# Patient Record
Sex: Female | Born: 1950
Health system: Southern US, Community
[De-identification: ages and names within clinical notes are randomized; demographics above are authoritative.]

## PROBLEM LIST (undated history)

## (undated) DIAGNOSIS — M1990 Unspecified osteoarthritis, unspecified site: Secondary | ICD-10-CM

## (undated) DIAGNOSIS — B019 Varicella without complication: Secondary | ICD-10-CM

## (undated) DIAGNOSIS — M810 Age-related osteoporosis without current pathological fracture: Secondary | ICD-10-CM

## (undated) HISTORY — DX: Age-related osteoporosis without current pathological fracture: M81.0

## (undated) HISTORY — DX: Unspecified osteoarthritis, unspecified site: M19.90

## (undated) HISTORY — PX: TONSILLECTOMY AND ADENOIDECTOMY: SUR1326

## (undated) HISTORY — DX: Varicella without complication: B01.9

## (undated) HISTORY — PX: RHINOPLASTY: SUR1284

---

## 1985-08-29 HISTORY — PX: TUBAL LIGATION: SHX77

## 2001-10-03 ENCOUNTER — Other Ambulatory Visit: Admission: RE | Admit: 2001-10-03 | Discharge: 2001-10-03 | Payer: Self-pay | Admitting: Family Medicine

## 2005-12-23 ENCOUNTER — Ambulatory Visit: Payer: Self-pay | Admitting: Gastroenterology

## 2005-12-23 LAB — HM COLONOSCOPY

## 2007-09-25 ENCOUNTER — Ambulatory Visit: Payer: Self-pay | Admitting: Obstetrics and Gynecology

## 2007-10-03 ENCOUNTER — Ambulatory Visit: Payer: Self-pay | Admitting: Obstetrics and Gynecology

## 2014-07-29 ENCOUNTER — Ambulatory Visit: Payer: Self-pay | Admitting: Family Medicine

## 2014-09-04 ENCOUNTER — Encounter (INDEPENDENT_AMBULATORY_CARE_PROVIDER_SITE_OTHER): Payer: Self-pay

## 2014-09-04 ENCOUNTER — Encounter: Payer: Self-pay | Admitting: Internal Medicine

## 2014-09-04 ENCOUNTER — Ambulatory Visit (INDEPENDENT_AMBULATORY_CARE_PROVIDER_SITE_OTHER): Payer: BLUE CROSS/BLUE SHIELD | Admitting: Internal Medicine

## 2014-09-04 VITALS — BP 132/86 | HR 85 | Temp 97.9°F | Ht 63.25 in | Wt 136.5 lb

## 2014-09-04 DIAGNOSIS — Z1239 Encounter for other screening for malignant neoplasm of breast: Secondary | ICD-10-CM

## 2014-09-04 DIAGNOSIS — Z0000 Encounter for general adult medical examination without abnormal findings: Secondary | ICD-10-CM

## 2014-09-04 DIAGNOSIS — Z1211 Encounter for screening for malignant neoplasm of colon: Secondary | ICD-10-CM

## 2014-09-04 DIAGNOSIS — Z23 Encounter for immunization: Secondary | ICD-10-CM

## 2014-09-04 DIAGNOSIS — M1990 Unspecified osteoarthritis, unspecified site: Secondary | ICD-10-CM

## 2014-09-04 NOTE — Addendum Note (Signed)
Addended by: Ellamae Sia on: 09/04/2014 11:42 AM   Modules accepted: Orders

## 2014-09-04 NOTE — Patient Instructions (Signed)

## 2014-09-04 NOTE — Progress Notes (Signed)
Pre visit review using our clinic review tool, if applicable. No additional management support is needed unless otherwise documented below in the visit note. 

## 2014-09-04 NOTE — Progress Notes (Signed)
HPI  Pt presents to the clinic today to establish care. She is transferring care from Dr. Richarda Blade.  Flu: yearly, wants one today Tetanus: 2014 Zostovax: never Pap Smear: 2013-normal Mammogram: 2013 Colon Screening: 2006 Vision Screening: as needed Dentist: biannually  Arthritis: Mostly in her hands. Takes Mobic occasionally for pain in her hands. It has improved since she retired.  Diet: She pretty much eats what she wants, small portions, healthy choices Exercise: Walks daily  Past Medical History  Diagnosis Date  . Arthritis   . Chicken pox     Current Outpatient Prescriptions  Medication Sig Dispense Refill  . meloxicam (MOBIC) 15 MG tablet Take 15 mg by mouth daily.  3   No current facility-administered medications for this visit.    No Known Allergies  Family History  Problem Relation Age of Onset  . Cancer Mother     Breast  . Alcohol abuse Father   . Cancer Father     Lung and Prostate  . Arthritis Son     History   Social History  . Marital Status: Married    Spouse Name: N/A    Number of Children: N/A  . Years of Education: N/A   Occupational History  . Not on file.   Social History Main Topics  . Smoking status: Never Smoker   . Smokeless tobacco: Never Used  . Alcohol Use: 0.0 oz/week    0 Not specified per week     Comment: occasional  . Drug Use: Not on file  . Sexual Activity: Not on file   Other Topics Concern  . Not on file   Social History Narrative  . No narrative on file    ROS:  Constitutional: Denies fever, malaise, fatigue, headache or abrupt weight changes.  HEENT: Denies eye pain, eye redness, ear pain, ringing in the ears, wax buildup, runny nose, nasal congestion, bloody nose, or sore throat. Respiratory: Denies difficulty breathing, shortness of breath, cough or sputum production.   Cardiovascular: Denies chest pain, chest tightness, palpitations or swelling in the hands or feet.  Gastrointestinal: Pt reports  constipation. Denies abdominal pain, bloating, diarrhea or blood in the stool.  GU: Pt reports stress incontinence. Denies frequency, urgency, pain with urination, blood in urine, odor or discharge. Musculoskeletal: Pt reports joint pain in hands. Denies decrease in range of motion, difficulty with gait, muscle pain or joint swelling.  Skin: Denies redness, rashes, lesions or ulcercations.  Neurological: Denies dizziness, difficulty with memory, difficulty with speech or problems with balance and coordination.   No other specific complaints in a complete review of systems (except as listed in HPI above).  PE:  BP 132/86 mmHg  Pulse 85  Temp(Src) 97.9 F (36.6 C) (Oral)  Ht 5' 3.25" (1.607 m)  Wt 136 lb 8 oz (61.916 kg)  BMI 23.98 kg/m2  SpO2 98% Wt Readings from Last 3 Encounters:  09/04/14 136 lb 8 oz (61.916 kg)    General: Appears her stated age, well developed, well nourished in NAD. Skin: Warm, dry and intact. No rashes or lesions noted. HEENT: Head: normal shape and size; Eyes: sclera white, no icterus, conjunctiva pink, PERRLA and EOMs intact; Ears: Tm's gray and intact, normal light reflex; Throat/Mouth: Teeth present, mucosa pink and moist, no lesions or ulcerations noted.  Neck:. Neck supple, trachea midline. No masses, lumps or thyromegaly present.  Cardiovascular: Normal rate and rhythm. S1,S2 noted.  No murmur, rubs or gallops noted. No JVD or BLE edema. No carotid bruits  noted. Pulmonary/Chest: Normal effort and positive vesicular breath sounds. No respiratory distress. No wheezes, rales or ronchi noted.  Abdomen: Soft and nontender. Normal bowel sounds, no bruits noted. No distention or masses noted. Liver, spleen and kidneys non palpable. Musculoskeletal: Normal range of motion. No signs of joint swelling. Strength 5/5 BUE/BLE. No difficulty with gait.  Neurological: Alert and oriented. Cranial nerves II-XII grossly intact. Coordination normal.  Psychiatric: Mood and  affect normal. Behavior is normal. Judgment and thought content normal.    Assessment and Plan:  Preventative Health Maintenance:  Flu shot today Will request record from [facility] about which tetanus vaccine she received- has 76 week old grandbaby She will call insurance to inquire about shingles vaccine Will order mammogram today GI referral placed today for colonoscopy Encouraged her to visit an eye doctor at least annually She will make an appt for her pap smear with me Will check CBC, CMET, and Lipid profile today  RTC in 1 year or sooner if needed

## 2014-09-05 NOTE — Addendum Note (Signed)
Addended by: Lurlean Nanny on: 09/05/2014 09:03 AM   Modules accepted: Orders

## 2014-09-08 ENCOUNTER — Other Ambulatory Visit: Payer: Self-pay | Admitting: Internal Medicine

## 2014-09-08 ENCOUNTER — Telehealth: Payer: Self-pay

## 2014-09-08 DIAGNOSIS — E875 Hyperkalemia: Secondary | ICD-10-CM

## 2014-09-08 DIAGNOSIS — Z23 Encounter for immunization: Secondary | ICD-10-CM

## 2014-09-08 MED ORDER — ZOSTER VACCINE LIVE 19400 UNT/0.65ML ~~LOC~~ SOLR: 0.6500 mL | Freq: Once | SUBCUTANEOUS | Status: DC

## 2014-09-08 NOTE — Telephone Encounter (Signed)
[facility], verified that she has had chicken pox

## 2014-09-08 NOTE — Telephone Encounter (Signed)
Pt request shingles vaccine order sent to [facility]. Pt request cb when order sent.

## 2014-09-09 ENCOUNTER — Encounter: Payer: Self-pay | Admitting: Family Medicine

## 2014-09-09 ENCOUNTER — Telehealth: Payer: Self-pay | Admitting: Internal Medicine

## 2014-09-09 NOTE — Telephone Encounter (Signed)
Patient received your message asking if she had had chicken pox.  Patient had chicken pox as a child.  Please call patient when prescription is sent to the pharmacy.

## 2014-09-09 NOTE — Telephone Encounter (Signed)
lmovm to check--however in her chart chicken pox is listed in her problem list

## 2014-09-09 NOTE — Telephone Encounter (Signed)
I did verify in the chart. I already sent the zoster vaccine RX to pharmacy

## 2014-09-10 NOTE — Telephone Encounter (Signed)
Pt left v/m requesting status of [facility]ax to [facility]; spoke with [facility] at [place] and pharmacy did receive order for zostavax and pt s ins does cover injection per melonie. Melonie said pt does not have to call for appt but request not to come between 4 - 7 pm due to busy time at pharmacy. Left detailed v/m for pt with this information.

## 2014-09-11 ENCOUNTER — Ambulatory Visit: Payer: Self-pay | Admitting: Internal Medicine

## 2014-09-12 ENCOUNTER — Encounter: Payer: Self-pay | Admitting: Internal Medicine

## 2014-09-23 ENCOUNTER — Telehealth: Payer: Self-pay

## 2014-09-23 NOTE — Telephone Encounter (Signed)
Pt was seen 09/04/14 and was advised would get immunization record from [facility] to see if pt had tdap; pt did not sign record release; Ebony Hail at front desk said pt would need to sign release to get info; pt said was told Webb Silversmith NP would take care of. Pt request cb to see if information received from [facility] before coming to office to sign release. Pt request cb.

## 2014-09-24 ENCOUNTER — Telehealth: Payer: Self-pay

## 2014-09-24 DIAGNOSIS — Z23 Encounter for immunization: Secondary | ICD-10-CM

## 2014-09-24 NOTE — Telephone Encounter (Signed)
Pt left v/m; pt checked with previous doctor's office and pt does need a tdap immunization. Pt request order for tdap sent to [facility]. Please advise. Pt request cb when ordered.

## 2014-09-24 NOTE — Telephone Encounter (Signed)
Will need release to be signed

## 2014-09-24 NOTE — Telephone Encounter (Signed)
Pt notified as instructed and pt voiced understanding. 

## 2014-09-25 ENCOUNTER — Other Ambulatory Visit: Payer: Self-pay | Admitting: Internal Medicine

## 2014-09-25 MED ORDER — TETANUS-DIPHTH-ACELL PERTUSSIS 5-2.5-18.5 LF-MCG/0.5 IM SUSP: 0.5000 mL | Freq: Once | INTRAMUSCULAR | Status: DC

## 2014-09-25 NOTE — Addendum Note (Signed)
Addended by: Jearld Fenton on: 09/25/2014 02:45 PM   Modules accepted: Orders

## 2014-09-25 NOTE — Telephone Encounter (Signed)
Can you print this out for me and I will sign and then we can fax to pharmacy

## 2014-09-25 NOTE — Telephone Encounter (Signed)
Signed and in Caitlyn Martinez's box

## 2014-09-26 NOTE — Telephone Encounter (Signed)
Rx faxed to pharmacy as requested.

## 2014-10-09 ENCOUNTER — Encounter: Payer: Self-pay | Admitting: Internal Medicine

## 2014-10-09 ENCOUNTER — Ambulatory Visit: Payer: Self-pay | Admitting: Unknown Physician Specialty

## 2014-10-09 LAB — HM COLONOSCOPY

## 2014-10-31 ENCOUNTER — Encounter: Payer: Self-pay | Admitting: Internal Medicine

## 2014-12-22 LAB — SURGICAL PATHOLOGY

## 2015-09-11 ENCOUNTER — Ambulatory Visit (INDEPENDENT_AMBULATORY_CARE_PROVIDER_SITE_OTHER): Payer: BLUE CROSS/BLUE SHIELD | Admitting: Internal Medicine

## 2015-09-11 ENCOUNTER — Encounter: Payer: Self-pay | Admitting: Internal Medicine

## 2015-09-11 VITALS — BP 130/84 | HR 81 | Temp 98.2°F | Ht 63.5 in | Wt 133.0 lb

## 2015-09-11 DIAGNOSIS — G4700 Insomnia, unspecified: Secondary | ICD-10-CM

## 2015-09-11 DIAGNOSIS — Z0001 Encounter for general adult medical examination with abnormal findings: Secondary | ICD-10-CM | POA: Diagnosis not present

## 2015-09-11 DIAGNOSIS — Z0000 Encounter for general adult medical examination without abnormal findings: Secondary | ICD-10-CM

## 2015-09-11 DIAGNOSIS — Z23 Encounter for immunization: Secondary | ICD-10-CM

## 2015-09-11 DIAGNOSIS — L301 Dyshidrosis [pompholyx]: Secondary | ICD-10-CM

## 2015-09-11 LAB — LIPID PANEL
CHOL/HDL RATIO: 2.9 ratio (ref <=5.0)
CHOLESTEROL: 172 mg/dL (ref 125–200)
HDL: 60 mg/dL (ref >=46)
LDL Cholesterol: 93 mg/dL (ref <130)
TRIGLYCERIDES: 95 mg/dL (ref <150)
VLDL: 19 mg/dL (ref <30)

## 2015-09-11 LAB — COMPREHENSIVE METABOLIC PANEL
ALBUMIN: 4.3 g/dL (ref 3.6–5.1)
ALT: 10 U/L (ref 6–29)
AST: 15 U/L (ref 10–35)
Alkaline Phosphatase: 69 U/L (ref 33–130)
BUN: 10 mg/dL (ref 7–25)
CALCIUM: 9.9 mg/dL (ref 8.6–10.4)
CHLORIDE: 99 mmol/L (ref 98–110)
CO2: 30 mmol/L (ref 20–31)
Creat: 0.65 mg/dL (ref 0.50–0.99)
Glucose, Bld: 98 mg/dL (ref 65–99)
POTASSIUM: 4.8 mmol/L (ref 3.5–5.3)
Sodium: 138 mmol/L (ref 135–146)
TOTAL PROTEIN: 6.8 g/dL (ref 6.1–8.1)
Total Bilirubin: 0.4 mg/dL (ref 0.2–1.2)

## 2015-09-11 LAB — CBC
HEMATOCRIT: 39.4 % (ref 36.0–46.0)
HEMOGLOBIN: 12.8 g/dL (ref 12.0–15.0)
MCH: 31 pg (ref 26.0–34.0)
MCHC: 32.5 g/dL (ref 30.0–36.0)
MCV: 95.4 fL (ref 78.0–100.0)
MPV: 10.6 fL (ref 8.6–12.4)
PLATELETS: 231 10*3/uL (ref 150–400)
RBC: 4.13 MIL/uL (ref 3.87–5.11)
RDW: 12.4 % (ref 11.5–15.5)
WBC: 6.5 10*3/uL (ref 4.0–10.5)

## 2015-09-11 LAB — HEMOGLOBIN A1C
Hgb A1c MFr Bld: 5.9 % — ABNORMAL HIGH (ref <5.7)
Mean Plasma Glucose: 123 mg/dL — ABNORMAL HIGH (ref <117)

## 2015-09-11 MED ORDER — TRAZODONE HCL 50 MG PO TABS: 25.0000 mg | ORAL_TABLET | Freq: Every evening | ORAL | Status: DC | PRN

## 2015-09-11 MED ORDER — CLOBETASOL PROPIONATE 0.05 % EX OINT: 1.0000 "application " | TOPICAL_OINTMENT | Freq: Two times a day (BID) | CUTANEOUS | Status: DC

## 2015-09-11 NOTE — Progress Notes (Signed)
HPI  Pt presents to the clinic today for her annual exam  Flu: yearly, wants one today Tetanus: 08/2014 Zostovax: 08/2014 Pap Smear: 2013-normal Mammogram: Mammogram, 08/2014 at [place] Screening: 09/2014 Vision Screening: as needed Dentist: biannually   Diet: She pretty much eats what she wants, small portions, healthy choices. She does consume daily caffeine. Exercise: Walks daily  She does c/o a scaly rash on her hands. This has been intermittent. It seems worse when she wears gloves or uses hand sanitizers. She has tried Cortisone cream without any relief.  She is also having trouble sleeping. She is able to fall asleep but has trouble staying asleep. She does get up 1-2 times per night to urinate. She does not feel stressed or anxious. She has had Ambien in the past but feels like it is too strong. She would like to try something different.  Past Medical History  Diagnosis Date  . Arthritis   . Chicken pox     No current outpatient prescriptions on file.   No current facility-administered medications for this visit.    No Known Allergies  Family History  Problem Relation Age of Onset  . Cancer Mother     Breast  . Alcohol abuse Father   . Cancer Father     Lung and Prostate  . Arthritis Son     Social History   Social History  . Marital Status: Married    Spouse Name: N/A  . Number of Children: N/A  . Years of Education: N/A   Occupational History  . Not on file.   Social History Main Topics  . Smoking status: Never Smoker   . Smokeless tobacco: Never Used  . Alcohol Use: 0.0 oz/week    0 Standard drinks or equivalent per week     Comment: occasional  . Drug Use: No  . Sexual Activity: Not on file   Other Topics Concern  . Not on file   Social History Narrative    ROS:  Constitutional: Denies fever, malaise, fatigue, headache or abrupt weight changes.  HEENT: Denies eye pain, eye redness, ear pain, ringing in the ears, wax buildup,  runny nose, nasal congestion, bloody nose, or sore throat. Respiratory: Denies difficulty breathing, shortness of breath, cough or sputum production.   Cardiovascular: Denies chest pain, chest tightness, palpitations or swelling in the hands or feet.  Gastrointestinal: Pt reports constipation. Denies abdominal pain, bloating, diarrhea or blood in the stool.  GU: Pt reports stress incontinence. Denies frequency, urgency, pain with urination, blood in urine, odor or discharge. Musculoskeletal: Pt reports joint pain in hands. Denies decrease in range of motion, difficulty with gait, muscle pain or joint swelling.  Skin: Pt reports rash on her hands. Denies redness, lesions or ulcercations.  Neurological: Pt reports insomnia. Denies dizziness, difficulty with memory, difficulty with speech or problems with balance and coordination.  Psych: Denies anxiety, depression, SI/HI.   No other specific complaints in a complete review of systems (except as listed in HPI above).  PE:  BP 130/84 mmHg  Pulse 81  Temp(Src) 98.2 F (36.8 C) (Oral)  Ht 5' 3.5" (1.613 m)  Wt 133 lb (60.328 kg)  BMI 23.19 kg/m2  SpO2 98% Wt Readings from Last 3 Encounters:  09/11/15 133 lb (60.328 kg)  09/04/14 136 lb 8 oz (61.916 kg)    General: Appears her stated age, well developed, well nourished in NAD. Skin: Warm, dry and intact. Dyshidrotic eczema noted on hands bilaterally. HEENT: Head: normal shape  and size; Eyes: sclera white, no icterus, conjunctiva pink, PERRLA and EOMs intact; Ears: Tm's gray and intact, normal light reflex; Throat/Mouth: Teeth present, mucosa pink and moist, no lesions or ulcerations noted.  Neck:. Neck supple, trachea midline. No masses, lumps or thyromegaly present.  Cardiovascular: Normal rate and rhythm. S1,S2 noted.  No murmur, rubs or gallops noted. No JVD or BLE edema. No carotid bruits noted. Pulmonary/Chest: Normal effort and positive vesicular breath sounds. No respiratory  distress. No wheezes, rales or ronchi noted.  Abdomen: Soft and nontender. Normal bowel sounds. No distention or masses noted. Liver, spleen and kidneys non palpable. Musculoskeletal: Normal range of motion. No signs of joint swelling. Strength 5/5 BUE/BLE. No difficulty with gait.  Neurological: Alert and oriented. Cranial nerves II-XII grossly intact. Coordination normal.  Psychiatric: Mood and affect normal. Behavior is normal. Judgment and thought content normal.    Assessment and Plan:  Preventative Health Maintenance:  Flu shot today Tetanus UTD She will call [facility] to schedule her Mammogram Colonoscopy UTD Encouraged her to visit an eye doctor and dentist at least annually Encouraged her to continue a healthy diet and exercise regimen Will check CBC, CMET, Lipid, A1C, HIV and Hep C today  Dyshidrotic eczema:  eRx for Clobetasol cream BID until resolved Use a moisturizing lotion daily Avoid alcohol based hand gels  Insomnia:  Discussed starting a good sleep routine eRx for Trazadone 25-50 mg QHS prn  RTC in 1 year or sooner if needed

## 2015-09-11 NOTE — Patient Instructions (Signed)

## 2015-09-11 NOTE — Progress Notes (Signed)
Pre visit review using our clinic review tool, if applicable. No additional management support is needed unless otherwise documented below in the visit note. 

## 2015-09-12 ENCOUNTER — Encounter: Payer: Self-pay | Admitting: Internal Medicine

## 2015-09-12 LAB — TSH: TSH: 1.305 u[IU]/mL (ref 0.350–4.500)

## 2015-09-12 LAB — HEPATITIS C ANTIBODY: HCV AB: NEGATIVE

## 2015-09-12 LAB — HIV ANTIBODY (ROUTINE TESTING W REFLEX): HIV 1&2 Ab, 4th Generation: NONREACTIVE

## 2015-09-15 ENCOUNTER — Ambulatory Visit
Admission: RE | Admit: 2015-09-15 | Discharge: 2015-09-15 | Disposition: A | Discharge status: Home / Self Care | Payer: BLUE CROSS/BLUE SHIELD | Source: Ambulatory Visit | Attending: Internal Medicine | Admitting: Internal Medicine

## 2015-09-15 DIAGNOSIS — Z1231 Encounter for screening mammogram for malignant neoplasm of breast: Secondary | ICD-10-CM | POA: Diagnosis not present

## 2015-09-15 DIAGNOSIS — Z1239 Encounter for other screening for malignant neoplasm of breast: Secondary | ICD-10-CM

## 2015-09-25 NOTE — Addendum Note (Signed)
Addended by: Lurlean Nanny on: 09/25/2015 09:11 AM   Modules accepted: Orders

## 2015-11-24 ENCOUNTER — Telehealth: Payer: Self-pay

## 2015-11-24 NOTE — Telephone Encounter (Signed)
Pt left v/m requesting cb about 09/11/15 wellness visit. Left v/m requesting cb.

## 2015-11-27 NOTE — Telephone Encounter (Signed)
Pt was checking on 09/11/15 bill She was receiving bill for $20 She had her preventative exam and discussed other things Pt aware of this

## 2015-11-27 NOTE — Telephone Encounter (Signed)
Opened in error

## 2016-01-16 ENCOUNTER — Other Ambulatory Visit: Payer: Self-pay | Admitting: Internal Medicine

## 2016-08-02 ENCOUNTER — Telehealth: Payer: Self-pay | Admitting: Internal Medicine

## 2016-08-02 ENCOUNTER — Other Ambulatory Visit: Payer: Self-pay | Admitting: Internal Medicine

## 2016-08-02 DIAGNOSIS — Z0000 Encounter for general adult medical examination without abnormal findings: Secondary | ICD-10-CM

## 2016-08-02 NOTE — Telephone Encounter (Signed)
Yes, labs entered

## 2016-08-02 NOTE — Telephone Encounter (Signed)
Pt has a cpx schedule 1/18 she would like to get labs done prior is it ok to schedule

## 2016-08-03 ENCOUNTER — Other Ambulatory Visit: Payer: Self-pay | Admitting: Internal Medicine

## 2016-08-03 NOTE — Telephone Encounter (Signed)
Left message asking pt to call office  °

## 2016-09-12 ENCOUNTER — Other Ambulatory Visit (INDEPENDENT_AMBULATORY_CARE_PROVIDER_SITE_OTHER): Payer: BLUE CROSS/BLUE SHIELD

## 2016-09-12 DIAGNOSIS — Z0000 Encounter for general adult medical examination without abnormal findings: Secondary | ICD-10-CM

## 2016-09-12 LAB — CBC
HEMATOCRIT: 39.4 % (ref 35.0–45.0)
Hemoglobin: 12.8 g/dL (ref 11.7–15.5)
MCH: 30.8 pg (ref 27.0–33.0)
MCHC: 32.5 g/dL (ref 32.0–36.0)
MCV: 94.7 fL (ref 80.0–100.0)
MPV: 11.4 fL (ref 7.5–12.5)
PLATELETS: 243 10*3/uL (ref 140–400)
RBC: 4.16 MIL/uL (ref 3.80–5.10)
RDW: 12.6 % (ref 11.0–15.0)
WBC: 4.9 10*3/uL (ref 3.8–10.8)

## 2016-09-12 NOTE — Addendum Note (Signed)
Addended by: Frutoso Chase A on: 09/12/2016 07:58 AM   Modules accepted: Orders

## 2016-09-13 LAB — COMPREHENSIVE METABOLIC PANEL
ALK PHOS: 73 U/L (ref 33–130)
ALT: 10 U/L (ref 6–29)
AST: 16 U/L (ref 10–35)
Albumin: 4.1 g/dL (ref 3.6–5.1)
BUN: 11 mg/dL (ref 7–25)
CALCIUM: 9.5 mg/dL (ref 8.6–10.4)
CHLORIDE: 103 mmol/L (ref 98–110)
CO2: 30 mmol/L (ref 20–31)
Creat: 0.7 mg/dL (ref 0.50–0.99)
GLUCOSE: 105 mg/dL — AB (ref 65–99)
Potassium: 4.9 mmol/L (ref 3.5–5.3)
Sodium: 140 mmol/L (ref 135–146)
TOTAL PROTEIN: 6.9 g/dL (ref 6.1–8.1)
Total Bilirubin: 0.4 mg/dL (ref 0.2–1.2)

## 2016-09-13 LAB — LIPID PANEL
CHOL/HDL RATIO: 2.6 ratio (ref <5.0)
CHOLESTEROL: 162 mg/dL (ref <200)
HDL: 62 mg/dL (ref >50)
LDL Cholesterol: 80 mg/dL (ref <100)
Triglycerides: 102 mg/dL (ref <150)
VLDL: 20 mg/dL (ref <30)

## 2016-09-13 LAB — HEMOGLOBIN A1C
Hgb A1c MFr Bld: 5.7 % — ABNORMAL HIGH (ref <5.7)
Mean Plasma Glucose: 117 mg/dL

## 2016-09-13 LAB — VITAMIN D 25 HYDROXY (VIT D DEFICIENCY, FRACTURES): Vit D, 25-Hydroxy: 44 ng/mL (ref 30–100)

## 2016-09-15 ENCOUNTER — Encounter: Payer: BLUE CROSS/BLUE SHIELD | Admitting: Internal Medicine

## 2016-09-23 ENCOUNTER — Ambulatory Visit (INDEPENDENT_AMBULATORY_CARE_PROVIDER_SITE_OTHER): Payer: BLUE CROSS/BLUE SHIELD | Admitting: Internal Medicine

## 2016-09-23 ENCOUNTER — Encounter: Payer: Self-pay | Admitting: Internal Medicine

## 2016-09-23 VITALS — BP 128/78 | HR 96 | Temp 99.5°F | Ht 63.75 in | Wt 135.0 lb

## 2016-09-23 DIAGNOSIS — Z1231 Encounter for screening mammogram for malignant neoplasm of breast: Secondary | ICD-10-CM

## 2016-09-23 DIAGNOSIS — B9789 Other viral agents as the cause of diseases classified elsewhere: Secondary | ICD-10-CM

## 2016-09-23 DIAGNOSIS — J329 Chronic sinusitis, unspecified: Secondary | ICD-10-CM | POA: Diagnosis not present

## 2016-09-23 DIAGNOSIS — Z124 Encounter for screening for malignant neoplasm of cervix: Secondary | ICD-10-CM

## 2016-09-23 DIAGNOSIS — F5101 Primary insomnia: Secondary | ICD-10-CM

## 2016-09-23 DIAGNOSIS — Z0001 Encounter for general adult medical examination with abnormal findings: Secondary | ICD-10-CM | POA: Diagnosis not present

## 2016-09-23 DIAGNOSIS — G4700 Insomnia, unspecified: Secondary | ICD-10-CM | POA: Insufficient documentation

## 2016-09-23 DIAGNOSIS — Z1239 Encounter for other screening for malignant neoplasm of breast: Secondary | ICD-10-CM

## 2016-09-23 MED ORDER — HYDROCODONE-HOMATROPINE 5-1.5 MG/5ML PO SYRP: 5.0000 mL | ORAL_SOLUTION | Freq: Three times a day (TID) | ORAL | 0 refills | Status: DC | PRN

## 2016-09-23 NOTE — Patient Instructions (Signed)

## 2016-09-23 NOTE — Assessment & Plan Note (Signed)
Advised her to try going back up to a whole tablet of Trazadone If not effective, let me know

## 2016-09-23 NOTE — Progress Notes (Signed)
Subjective:    Patient ID: Caitlyn Martinez, female    DOB: [birth year], 66 y.o.   MRN: [id number]  HPI  Pt presents to the clinic today for her annual exam. She is also due to follow up chronic conditions.  Insomnia: She has trouble staying asleep. She was prescribed Trazadone 1 year ago, but she reports she took it 1 time and it made her feel very jittery. One week ago, she started taking 1/2 tablet ofTrazadone but has not noticed that it has helped with her sleep.  She also reports headaches, nasal congestion, cough and chest congestion.This started 4 days ago. She is not blowing anything out of her nose. She denies ear pain or sore throat. The cough is productive of clear mucous. She has had a fever but denies chills or body aches. She has tried Sudafed, Afrin, Mucinex, Dayquil and Nyquil with some relief. She has had sick contacts. She has not had her flu shot this year.  Flu: 08/2015 Tetanus: 08/2014 Zostavax: 08/2014 Pap Smear: 2013 Mammogram: 08/2015 Colon Screening: 09/2014 Vision Screening: as needed Dentist: yearly  Diet: She does eat meat. She consumes fruits and veggies most day. She does eat some fried foods. She drinks mostly sweet tea. Exercise: She walks 3-4 miles daily.   Review of Systems  Past Medical History:  Diagnosis Date  . Arthritis   . Chicken pox     Current Outpatient Prescriptions  Medication Sig Dispense Refill  . clobetasol ointment (TEMOVATE) [id number] % APPLY 1 APPLICATION TOPICALLY 2 (TWO) TIMES DAILY. 30 g 0  . traZODone (DESYREL) 50 MG tablet Take 0.5-1 tablets (25-50 mg total) by mouth at bedtime as needed for sleep. 30 tablet 1   No current facility-administered medications for this visit.     No Known Allergies  Family History  Problem Relation Age of Onset  . Cancer Mother     Breast  . Breast cancer Mother 67  . Alcohol abuse Father   . Cancer Father     Lung and Prostate  . Arthritis Son     Social History   Social History  .  Marital status: Married    Spouse name: N/A  . Number of children: N/A  . Years of education: N/A   Occupational History  . Not on file.   Social History Main Topics  . Smoking status: Never Smoker  . Smokeless tobacco: Never Used  . Alcohol use 0.0 oz/week     Comment: occasional  . Drug use: No  . Sexual activity: Not on file   Other Topics Concern  . Not on file   Social History Narrative  . No narrative on file     Constitutional: Pt reports headache and fever. Denies malaise, fatigue, or abrupt weight changes.  HEENT: Pt reports nasal congestion. Denies eye pain, eye redness, ear pain, ringing in the ears, wax buildup, runny nose, bloody nose, or sore throat. Respiratory: Pt reports cough. Denies difficulty breathing, shortness of breath, or sputum production.   Cardiovascular: Denies chest pain, chest tightness, palpitations or swelling in the hands or feet.  Gastrointestinal: Denies abdominal pain, bloating, constipation, diarrhea or blood in the stool.  GU: Pt reports stress incontinence. Denies urgency, frequency, pain with urination, burning sensation, blood in urine, odor or discharge. Musculoskeletal: Denies decrease in range of motion, difficulty with gait, muscle pain or joint pain and swelling.  Neurological: Pt reports insomnia. Denies dizziness, difficulty with memory, difficulty with speech or problems with balance and  coordination.  Psych: Denies anxiety, depression, SI/HI.  No other specific complaints in a complete review of systems (except as listed in HPI above).     Objective:   Physical Exam   BP 128/78   Pulse 96   Temp 99.5 F (37.5 C) (Oral)   Ht 5' 3.75" (1.619 m)   Wt 135 lb (61.2 kg)   SpO2 97%   BMI 23.35 kg/m  Wt Readings from Last 3 Encounters:  09/23/16 135 lb (61.2 kg)  09/11/15 133 lb (60.3 kg)  09/04/14 136 lb 8 oz (61.9 kg)    General: Appears her stated age, well developed, well nourished in NAD. Skin: Warm, dry and  intact.  HEENT: Head: normal shape and size, no sinus tenderness noted; Eyes: sclera white, no icterus, conjunctiva pink, PERRLA and EOMs intact; Ears: Tm's gray and intact, normal light reflex; Nose: mucosa pink and moist, septum midline; Throat/Mouth: Teeth present, mucosa erythematous and moist, + PND, no exudate, lesions or ulcerations noted.  Neck:  Neck supple, trachea midline. No masses, lumps or thyromegaly present.  Cardiovascular: Normal rate and rhythm. S1,S2 noted.  No murmur, rubs or gallops noted. No JVD or BLE edema. No carotid bruits noted. Pulmonary/Chest: Normal effort and positive vesicular breath sounds. No respiratory distress. No wheezes, rales or ronchi noted.  Abdomen: Soft and nontender. Normal bowel sounds. No distention or masses noted. Liver, spleen and kidneys non palpable. Pelvic: Normal female anatomy. Cystocele noted. Cervix a little red, os opened. No CMT. Adnexa non palpable. Musculoskeletal: Strength 5/5 BUE/BLE. No difficulty with gait.  Neurological: Alert and oriented. Cranial nerves II-XII grossly intact. Coordination normal.  Psychiatric: Mood and affect normal. Behavior is normal. Judgment and thought content normal.     BMET    Component Value Date/Time   NA 140 09/12/2016 0759   K 4.9 09/12/2016 0759   CL 103 09/12/2016 0759   CO2 30 09/12/2016 0759   GLUCOSE 105 (H) 09/12/2016 0759   BUN 11 09/12/2016 0759   CREATININE 0.70 09/12/2016 0759   CALCIUM 9.5 09/12/2016 0759    Lipid Panel     Component Value Date/Time   CHOL 162 09/12/2016 0759   TRIG 102 09/12/2016 0759   HDL 62 09/12/2016 0759   CHOLHDL 2.6 09/12/2016 0759   VLDL 20 09/12/2016 0759   LDLCALC 80 09/12/2016 0759    CBC    Component Value Date/Time   WBC 4.9 09/12/2016 0759   RBC 4.16 09/12/2016 0759   HGB 12.8 09/12/2016 0759   HCT 39.4 09/12/2016 0759   PLT 243 09/12/2016 0759   MCV 94.7 09/12/2016 0759   MCH 30.8 09/12/2016 0759   MCHC 32.5 09/12/2016 0759   RDW  12.6 09/12/2016 0759    Hgb A1C Lab Results  Component Value Date   HGBA1C 5.7 (H) 09/12/2016         Assessment & Plan:   Preventative Health Maintenance:  She can not get flu shot today, she will make nurse visit or get it at the pharmacy Tetanus and Zostovax UTD Mammogram ordered- she will call [facility] to schedule, number provided Pap smear today, she declines STD screening Encouraged her to consume a balanced diet and exercise regimen Advised her to see an eye doctor and dentist annually Labs from 1 week ago reviewed  Viral Sinusitis:  Stop Afrin and Sudafed Start steroid nasal spray OTC Continue Mucinex RX for Hycodan for cough Return precautions discussed  RTC in 1 year, sooner if needed BAITY, REGINA,  NP

## 2016-09-26 ENCOUNTER — Other Ambulatory Visit (HOSPITAL_COMMUNITY)
Admission: RE | Admit: 2016-09-26 | Discharge: 2016-09-26 | Disposition: A | Discharge status: Home / Self Care | Payer: BLUE CROSS/BLUE SHIELD | Source: Ambulatory Visit | Attending: Internal Medicine | Admitting: Internal Medicine

## 2016-09-26 DIAGNOSIS — Z01419 Encounter for gynecological examination (general) (routine) without abnormal findings: Secondary | ICD-10-CM | POA: Diagnosis present

## 2016-09-26 DIAGNOSIS — Z1151 Encounter for screening for human papillomavirus (HPV): Secondary | ICD-10-CM | POA: Diagnosis not present

## 2016-09-26 NOTE — Addendum Note (Signed)
Addended by: Lurlean Nanny on: 09/26/2016 08:45 AM   Modules accepted: Orders

## 2016-09-28 LAB — CYTOLOGY - PAP
Diagnosis: NEGATIVE
HPV: NOT DETECTED

## 2016-10-03 ENCOUNTER — Encounter: Payer: Self-pay | Admitting: Internal Medicine

## 2016-10-03 ENCOUNTER — Other Ambulatory Visit: Payer: Self-pay | Admitting: Internal Medicine

## 2016-10-03 ENCOUNTER — Ambulatory Visit (INDEPENDENT_AMBULATORY_CARE_PROVIDER_SITE_OTHER): Payer: BLUE CROSS/BLUE SHIELD | Admitting: Internal Medicine

## 2016-10-03 VITALS — BP 132/80 | HR 86 | Temp 98.0°F | Wt 133.8 lb

## 2016-10-03 DIAGNOSIS — J0100 Acute maxillary sinusitis, unspecified: Secondary | ICD-10-CM

## 2016-10-03 DIAGNOSIS — Z23 Encounter for immunization: Secondary | ICD-10-CM | POA: Diagnosis not present

## 2016-10-03 MED ORDER — PREDNISONE 10 MG PO TABS: ORAL_TABLET | ORAL | 0 refills | Status: DC

## 2016-10-03 MED ORDER — AMOXICILLIN-POT CLAVULANATE 875-125 MG PO TABS: 1.0000 | ORAL_TABLET | Freq: Two times a day (BID) | ORAL | 0 refills | Status: DC

## 2016-10-03 NOTE — Addendum Note (Signed)
Addended by: Lurlean Nanny on: 10/03/2016 11:59 AM   Modules accepted: Orders

## 2016-10-03 NOTE — Progress Notes (Signed)
HPI  Pt presents to the clinic today to follow up viral sinusitis. Her symptoms started 09/19/16. She was seen 09/23/16 for her physical and advised to stop Afrin and Sudafed. Start Flonase OTC, continue Mucinex. She was given a RX for Hycodan for cough. Since that time, she reports she has ongoing headache, facial pain and pressure, nasal congestion and pain in her teeth. She is blowing thick, yellow mucous out of her nose. She denies fever or chills but has had body aches. She has tried Afrin, Dayquil and Nyquil with minimal relief.  Review of Systems     Past Medical History:  Diagnosis Date  . Arthritis   . Chicken pox     Family History  Problem Relation Age of Onset  . Cancer Mother     Breast  . Breast cancer Mother 47  . Alcohol abuse Father   . Cancer Father     Lung and Prostate  . Arthritis Son     Social History   Social History  . Marital status: Married    Spouse name: N/A  . Number of children: N/A  . Years of education: N/A   Occupational History  . Not on file.   Social History Main Topics  . Smoking status: Never Smoker  . Smokeless tobacco: Never Used  . Alcohol use 0.0 oz/week     Comment: occasional  . Drug use: No  . Sexual activity: Not on file   Other Topics Concern  . Not on file   Social History Narrative  . No narrative on file    No Known Allergies   Constitutional: Positive headache, fatigue. Denies fever abrupt weight changes.  HEENT:  Positive facial pain, nasal congestion and teeth pain. Denies eye redness, ear pain, ringing in the ears, wax buildup, runny nose or sore throat. Respiratory:  Denies cough, difficulty breathing or shortness of breath.  Cardiovascular: Denies chest pain, chest tightness, palpitations or swelling in the hands or feet.   No other specific complaints in a complete review of systems (except as listed in HPI above).  Objective:   BP 132/80   Pulse 86   Temp 98 F (36.7 C) (Oral)   Wt 133 lb 12 oz  (60.7 kg)   SpO2 97%   BMI 23.14 kg/m   General: Appears her stated age, in NAD. HEENT: Head: normal shape and size, maxillary sinus tenderness noted; Eyes: sclera white, no icterus, conjunctiva pink; Ears: Tm's gray and intact, normal light reflex; Nose: mucosa pinkand moist, septum midline; Throat/Mouth: + PND. Teeth present, mucosa pink and moist, no exudate noted, no lesions or ulcerations noted.  Neck:  No adenopathy noted.  Cardiovascular: Normal rate and rhythm.  Pulmonary/Chest: Normal effort and positive vesicular breath sounds with intermittent expiratory wheeze noted. No respiratory distress. No wheezes, rales or ronchi noted.       Assessment & Plan:   Acute bacterial sinusitis  Can use a Neti Pot which can be purchased from your local drug store. eRx for Pred Taper x 6 days eRx for Augmentin BID for 10 days- discussed GI upset  RTC as needed or if symptoms persist. Webb Silversmith, NP

## 2016-10-03 NOTE — Patient Instructions (Signed)

## 2016-10-25 ENCOUNTER — Ambulatory Visit: Payer: BLUE CROSS/BLUE SHIELD

## 2016-11-08 ENCOUNTER — Ambulatory Visit: Payer: BLUE CROSS/BLUE SHIELD

## 2016-11-22 ENCOUNTER — Ambulatory Visit
Admission: RE | Admit: 2016-11-22 | Discharge: 2016-11-22 | Disposition: A | Discharge status: Home / Self Care | Payer: BLUE CROSS/BLUE SHIELD | Source: Ambulatory Visit | Attending: Internal Medicine | Admitting: Internal Medicine

## 2016-11-22 ENCOUNTER — Other Ambulatory Visit: Payer: Self-pay | Admitting: *Deleted

## 2016-11-22 DIAGNOSIS — Z1239 Encounter for other screening for malignant neoplasm of breast: Secondary | ICD-10-CM

## 2016-11-22 DIAGNOSIS — Z1231 Encounter for screening mammogram for malignant neoplasm of breast: Secondary | ICD-10-CM | POA: Insufficient documentation

## 2016-11-22 MED ORDER — CLOBETASOL PROPIONATE 0.05 % EX OINT: 1.0000 "application " | TOPICAL_OINTMENT | Freq: Two times a day (BID) | CUTANEOUS | 0 refills | Status: DC

## 2016-11-22 NOTE — Telephone Encounter (Signed)
Pt is requesting a refill of clobetasol ointment. Medication no longer on pts med list. Last OV 08/2016-CPE

## 2016-11-22 NOTE — Addendum Note (Signed)
Addended by: Jearld Fenton on: 11/22/2016 09:05 AM   Modules accepted: Orders

## 2016-11-22 NOTE — Telephone Encounter (Signed)
Clobetasol sent to pharmacy

## 2016-12-19 ENCOUNTER — Encounter: Payer: Self-pay | Admitting: Internal Medicine

## 2016-12-19 ENCOUNTER — Ambulatory Visit (INDEPENDENT_AMBULATORY_CARE_PROVIDER_SITE_OTHER): Payer: BLUE CROSS/BLUE SHIELD | Admitting: Internal Medicine

## 2016-12-19 VITALS — BP 126/82 | HR 82 | Temp 98.0°F | Wt 136.0 lb

## 2016-12-19 DIAGNOSIS — R3989 Other symptoms and signs involving the genitourinary system: Secondary | ICD-10-CM

## 2016-12-19 DIAGNOSIS — R300 Dysuria: Secondary | ICD-10-CM | POA: Diagnosis not present

## 2016-12-19 LAB — POC URINALSYSI DIPSTICK (AUTOMATED)
BILIRUBIN UA: NEGATIVE
Glucose, UA: NEGATIVE
KETONES UA: NEGATIVE
NITRITE UA: NEGATIVE
PH UA: 6.5 (ref 5.0–8.0)
PROTEIN UA: NEGATIVE
RBC UA: NEGATIVE
Spec Grav, UA: 1.02 (ref 1.010–1.025)
Urobilinogen, UA: 0.2 E.U./dL

## 2016-12-19 MED ORDER — NITROFURANTOIN MONOHYD MACRO 100 MG PO CAPS: 100.0000 mg | ORAL_CAPSULE | Freq: Two times a day (BID) | ORAL | 0 refills | Status: DC

## 2016-12-19 MED ORDER — CLOBETASOL PROPIONATE 0.05 % EX OINT: 1.0000 "application " | TOPICAL_OINTMENT | Freq: Two times a day (BID) | CUTANEOUS | 0 refills | Status: DC

## 2016-12-19 NOTE — Progress Notes (Signed)
HPI  Pt presents to the clinic today with c/o bladder pressure. She reports this started 10 days ago. She reports associated dysuria, but denies urgency, frequency. She denies fever, chills, nausea or low back pain. She has tried Cranberry juice with minimal relief. She denies vaginal discharge. She does drink a lot of caffeine.   Review of Systems  Past Medical History:  Diagnosis Date  . Arthritis   . Chicken pox     Family History  Problem Relation Age of Onset  . Cancer Mother     Breast  . Breast cancer Mother 2  . Alcohol abuse Father   . Cancer Father     Lung and Prostate  . Arthritis Son     Social History   Social History  . Marital status: Married    Spouse name: N/A  . Number of children: N/A  . Years of education: N/A   Occupational History  . Not on file.   Social History Main Topics  . Smoking status: Never Smoker  . Smokeless tobacco: Never Used  . Alcohol use 0.0 oz/week     Comment: occasional  . Drug use: No  . Sexual activity: Not on file   Other Topics Concern  . Not on file   Social History Narrative  . No narrative on file    No Known Allergies   Constitutional: Denies fever, malaise, fatigue, headache or abrupt weight changes.   GU: Pt reports bladder pressure and pain with urination. Denies urgency, frequency, burning sensation, blood in urine, odor or discharge. Skin: Denies redness, rashes, lesions or ulcercations.   No other specific complaints in a complete review of systems (except as listed in HPI above).    Objective:   Physical Exam  BP 126/82   Pulse 82   Temp 98 F (36.7 C) (Oral)   Wt 136 lb (61.7 kg)   SpO2 99%   BMI 23.53 kg/m  Wt Readings from Last 3 Encounters:  12/19/16 136 lb (61.7 kg)  10/03/16 133 lb 12 oz (60.7 kg)  09/23/16 135 lb (61.2 kg)    General: Appears her stated age, well developed, well nourished in NAD. Abdomen: Soft. Normal bowel sounds. No distention or masses noted.  Tender to  palpation over the bladder area. No CVA tenderness.       Assessment & Plan:   Bladder Pressure and Dysuria secondary to Possible UTI:  Urinalysis: 1+leuks Will send urine culture eRx sent if for Macrobid 100 mg BID x 5 days OK to take AZO OTC Drink plenty of fluids  RTC as needed or if symptoms persist. Webb Silversmith, NP

## 2016-12-19 NOTE — Patient Instructions (Signed)

## 2016-12-19 NOTE — Addendum Note (Signed)
Addended by: Lurlean Nanny on: 12/19/2016 12:27 PM   Modules accepted: Orders

## 2016-12-22 LAB — URINE CULTURE

## 2017-04-10 IMAGING — MG MM DIGITAL SCREENING BILAT W/ CAD
6 series · 6 of 6 positions shown · non-contrast
Comparison: Previous exam(s).

CLINICAL DATA: Screening.

EXAM:
DIGITAL SCREENING BILATERAL MAMMOGRAM WITH CAD

[R MLO (1 of 2)]
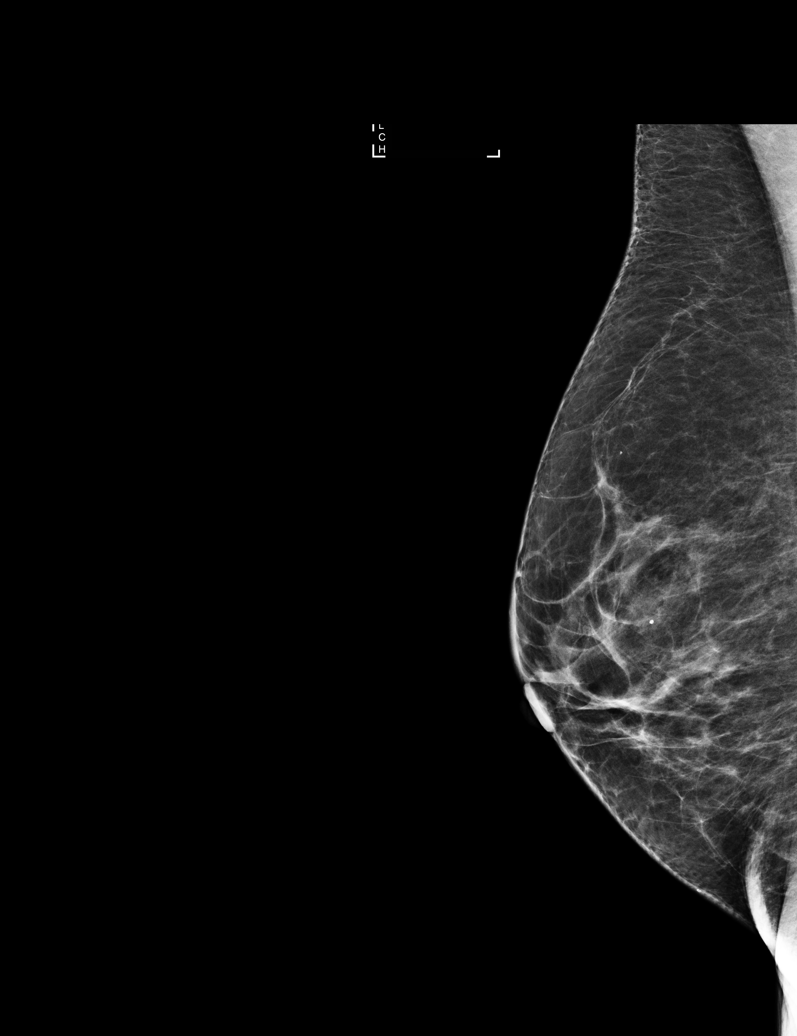

[L MLO (1 of 2)]
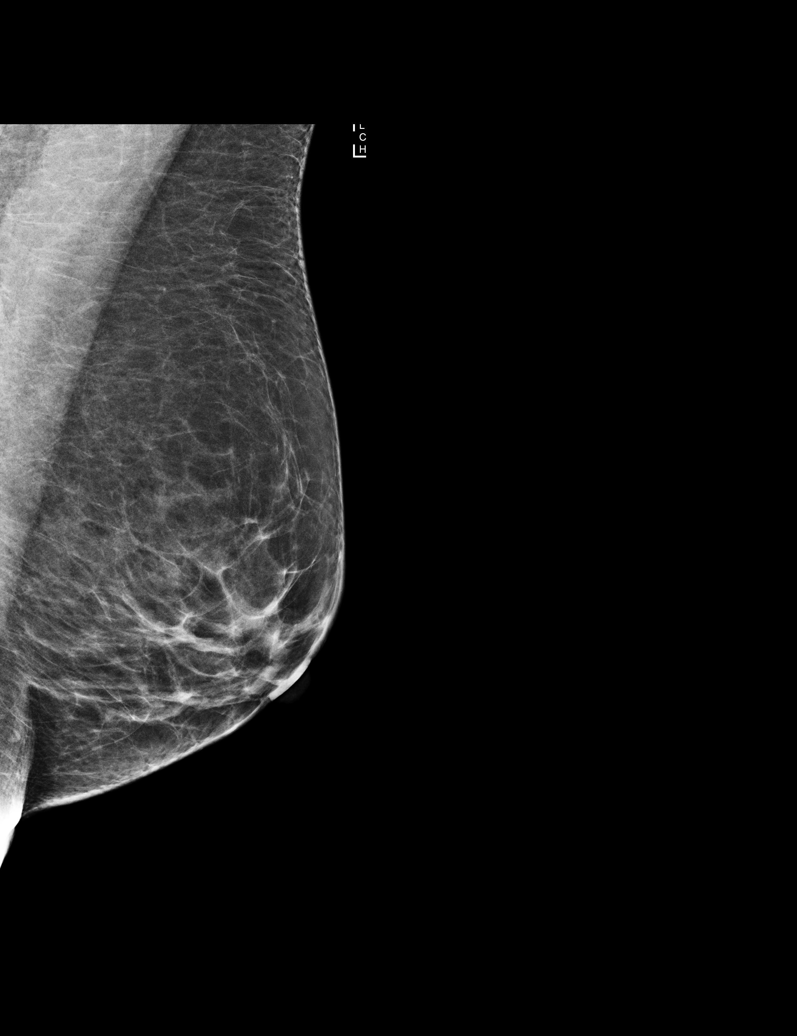

[L MLO (2 of 2)]
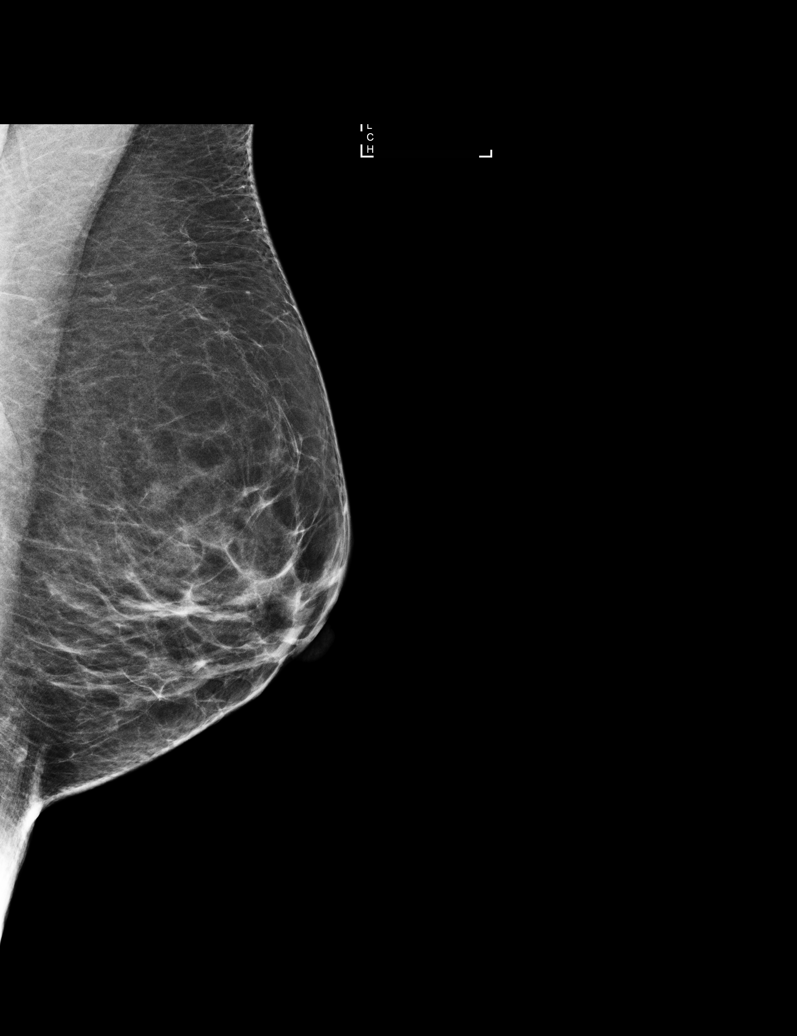

[R MLO (2 of 2)]
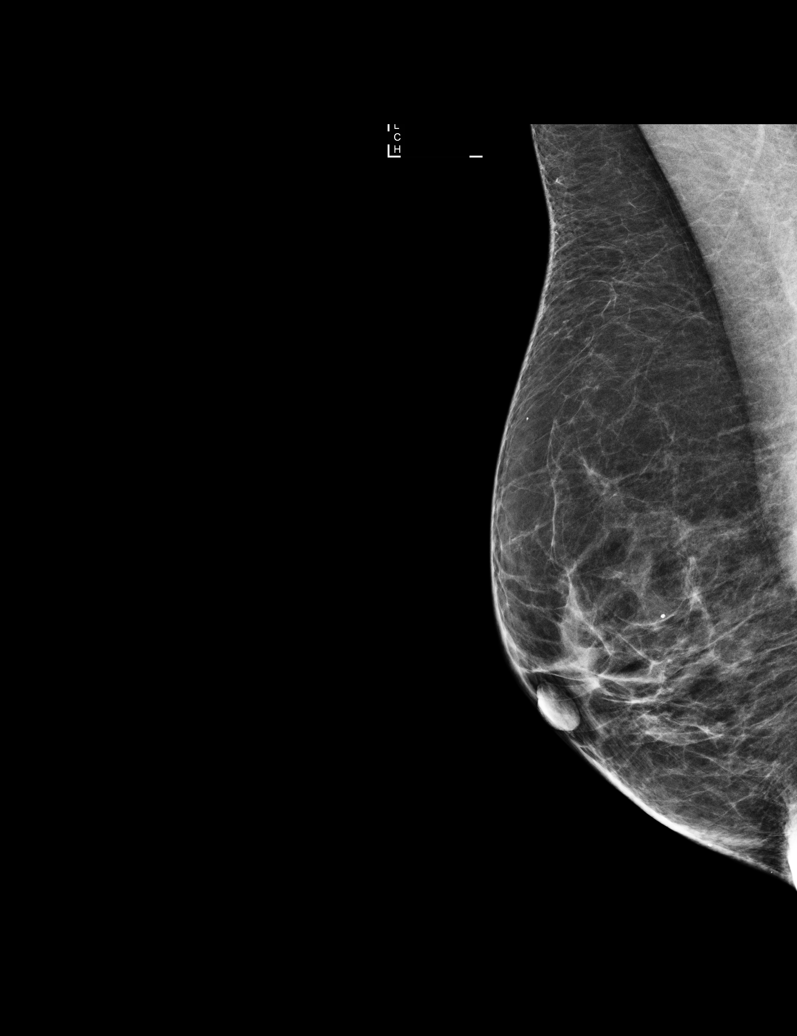

[R CC]
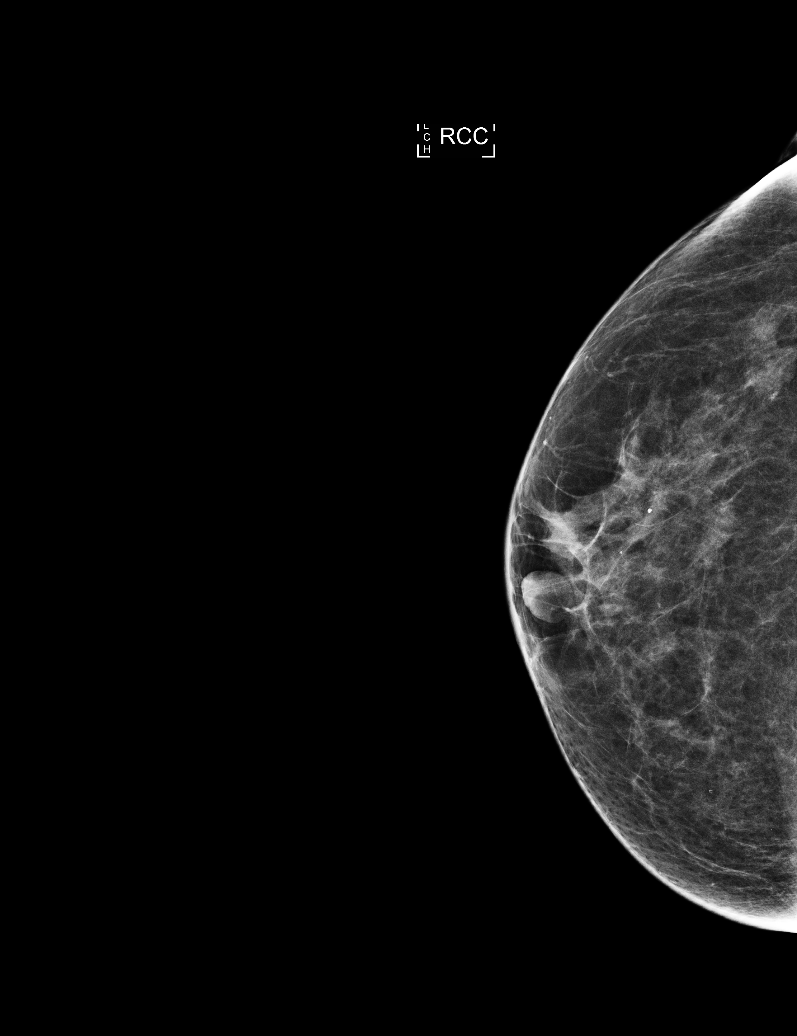

[L CC]
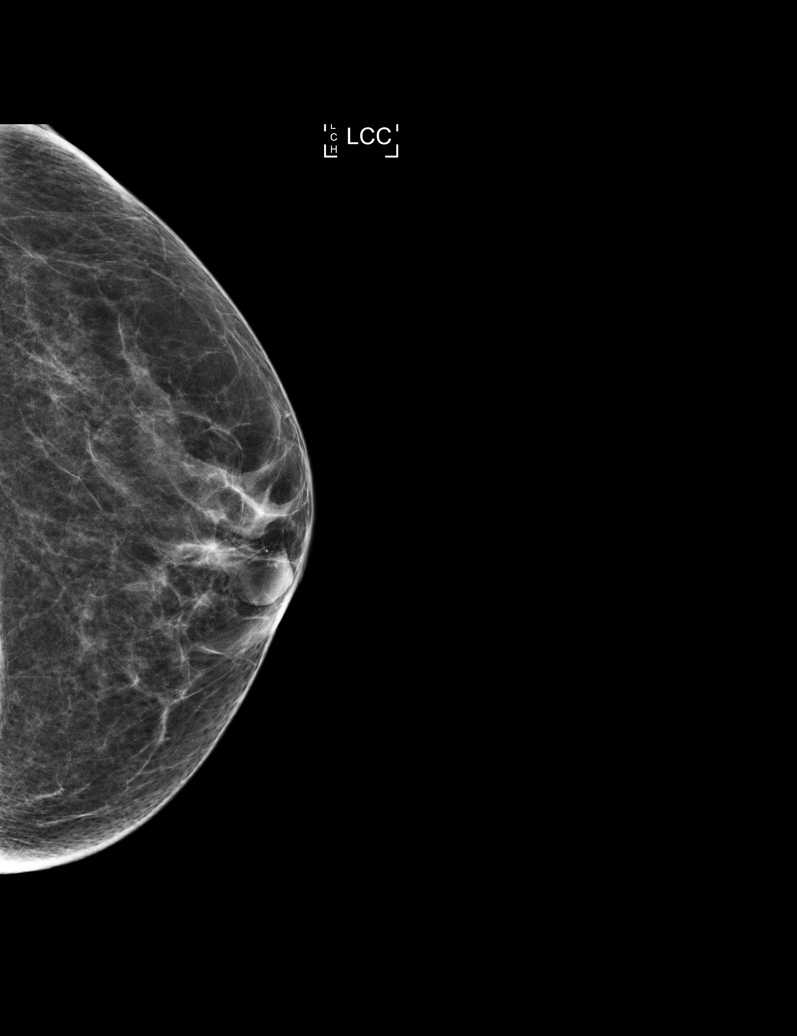

[6 of 6 positions shown; findings below may reference images not displayed]

ACR Breast Density Category b: There are scattered areas of
fibroglandular density.
FINDINGS: There are no findings suspicious for malignancy. Images were
processed with CAD.
IMPRESSION: No mammographic evidence of malignancy. A result letter of this
screening mammogram will be mailed directly to the patient.

RECOMMENDATION:
Screening mammogram in one year. (Code:AS-G-LCT)

BI-RADS CATEGORY  1: Negative.

## 2017-09-05 DIAGNOSIS — D225 Melanocytic nevi of trunk: Secondary | ICD-10-CM | POA: Diagnosis not present

## 2017-09-05 DIAGNOSIS — D2261 Melanocytic nevi of right upper limb, including shoulder: Secondary | ICD-10-CM | POA: Diagnosis not present

## 2017-09-05 DIAGNOSIS — L821 Other seborrheic keratosis: Secondary | ICD-10-CM | POA: Diagnosis not present

## 2017-09-05 DIAGNOSIS — D2272 Melanocytic nevi of left lower limb, including hip: Secondary | ICD-10-CM | POA: Diagnosis not present

## 2017-09-11 ENCOUNTER — Encounter: Payer: Self-pay | Admitting: Internal Medicine

## 2017-09-11 ENCOUNTER — Ambulatory Visit (INDEPENDENT_AMBULATORY_CARE_PROVIDER_SITE_OTHER): Payer: Medicare Other | Admitting: Internal Medicine

## 2017-09-11 VITALS — BP 122/78 | HR 81 | Temp 98.0°F | Ht 63.0 in | Wt 135.0 lb

## 2017-09-11 DIAGNOSIS — Z780 Asymptomatic menopausal state: Secondary | ICD-10-CM

## 2017-09-11 DIAGNOSIS — Z0000 Encounter for general adult medical examination without abnormal findings: Secondary | ICD-10-CM | POA: Diagnosis not present

## 2017-09-11 DIAGNOSIS — E559 Vitamin D deficiency, unspecified: Secondary | ICD-10-CM

## 2017-09-11 DIAGNOSIS — Z23 Encounter for immunization: Secondary | ICD-10-CM | POA: Diagnosis not present

## 2017-09-11 MED ORDER — FLUTICASONE PROPIONATE 50 MCG/ACT NA SUSP: 2.0000 | Freq: Every day | NASAL | 6 refills | Status: DC

## 2017-09-11 MED ORDER — CLOBETASOL PROPIONATE 0.05 % EX OINT
1.0000 | TOPICAL_OINTMENT | Freq: Two times a day (BID) | CUTANEOUS | 0 refills | Status: DC
Start: 2017-09-11 — End: 2017-09-19

## 2017-09-11 MED ORDER — CLOBETASOL PROPIONATE 0.05 % EX OINT: 1.0000 "application " | TOPICAL_OINTMENT | Freq: Two times a day (BID) | CUTANEOUS | 0 refills | Status: DC

## 2017-09-11 NOTE — Addendum Note (Signed)
Addended by: Modena Nunnery on: 09/11/2017 03:35 PM   Modules accepted: Orders

## 2017-09-11 NOTE — Addendum Note (Signed)
Addended by: Modena Nunnery on: 09/11/2017 03:15 PM   Modules accepted: Orders

## 2017-09-11 NOTE — Patient Instructions (Signed)
Health Maintenance for Postmenopausal Women Menopause is a normal process in which your reproductive ability comes to an end. This process happens gradually over a span of months to years, usually between the ages of 22 and 9. Menopause is complete when you have missed 12 consecutive menstrual periods. It is important to talk with your health care provider about some of the most common conditions that affect postmenopausal women, such as heart disease, cancer, and bone loss (osteoporosis). Adopting a healthy lifestyle and getting preventive care can help to promote your health and wellness. Those actions can also lower your chances of developing some of these common conditions. What should I know about menopause? During menopause, you may experience a number of symptoms, such as:  Moderate-to-severe hot flashes.  Night sweats.  Decrease in sex drive.  Mood swings.  Headaches.  Tiredness.  Irritability.  Memory problems.  Insomnia.  Choosing to treat or not to treat menopausal changes is an individual decision that you make with your health care provider. What should I know about hormone replacement therapy and supplements? Hormone therapy products are effective for treating symptoms that are associated with menopause, such as hot flashes and night sweats. Hormone replacement carries certain risks, especially as you become older. If you are thinking about using estrogen or estrogen with progestin treatments, discuss the benefits and risks with your health care provider. What should I know about heart disease and stroke? Heart disease, heart attack, and stroke become more likely as you age. This may be due, in part, to the hormonal changes that your body experiences during menopause. These can affect how your body processes dietary fats, triglycerides, and cholesterol. Heart attack and stroke are both medical emergencies. There are many things that you can do to help prevent heart disease  and stroke:  Have your blood pressure checked at least every 1-2 years. High blood pressure causes heart disease and increases the risk of stroke.  If you are 53-22 years old, ask your health care provider if you should take aspirin to prevent a heart attack or a stroke.  Do not use any tobacco products, including cigarettes, chewing tobacco, or electronic cigarettes. If you need help quitting, ask your health care provider.  It is important to eat a healthy diet and maintain a healthy weight. ? Be sure to include plenty of vegetables, fruits, low-fat dairy products, and lean protein. ? Avoid eating foods that are high in solid fats, added sugars, or salt (sodium).  Get regular exercise. This is one of the most important things that you can do for your health. ? Try to exercise for at least 150 minutes each week. The type of exercise that you do should increase your heart rate and make you sweat. This is known as moderate-intensity exercise. ? Try to do strengthening exercises at least twice each week. Do these in addition to the moderate-intensity exercise.  Know your numbers.Ask your health care provider to check your cholesterol and your blood glucose. Continue to have your blood tested as directed by your health care provider.  What should I know about cancer screening? There are several types of cancer. Take the following steps to reduce your risk and to catch any cancer development as early as possible. Breast Cancer  Practice breast self-awareness. ? This means understanding how your breasts normally appear and feel. ? It also means doing regular breast self-exams. Let your health care provider know about any changes, no matter how small.  If you are 40  or older, have a clinician do a breast exam (clinical breast exam or CBE) every year. Depending on your age, family history, and medical history, it may be recommended that you also have a yearly breast X-ray (mammogram).  If you  have a family history of breast cancer, talk with your health care provider about genetic screening.  If you are at high risk for breast cancer, talk with your health care provider about having an MRI and a mammogram every year.  Breast cancer (BRCA) gene test is recommended for women who have family members with BRCA-related cancers. Results of the assessment will determine the need for genetic counseling and BRCA1 and for BRCA2 testing. BRCA-related cancers include these types: ? Breast. This occurs in males or females. ? Ovarian. ? Tubal. This may also be called fallopian tube cancer. ? Cancer of the abdominal or pelvic lining (peritoneal cancer). ? Prostate. ? Pancreatic.  Cervical, Uterine, and Ovarian Cancer Your health care provider may recommend that you be screened regularly for cancer of the pelvic organs. These include your ovaries, uterus, and vagina. This screening involves a pelvic exam, which includes checking for microscopic changes to the surface of your cervix (Pap test).  For women ages 21-65, health care providers may recommend a pelvic exam and a Pap test every three years. For women ages 79-65, they may recommend the Pap test and pelvic exam, combined with testing for human papilloma virus (HPV), every five years. Some types of HPV increase your risk of cervical cancer. Testing for HPV may also be done on women of any age who have unclear Pap test results.  Other health care providers may not recommend any screening for nonpregnant women who are considered low risk for pelvic cancer and have no symptoms. Ask your health care provider if a screening pelvic exam is right for you.  If you have had past treatment for cervical cancer or a condition that could lead to cancer, you need Pap tests and screening for cancer for at least 20 years after your treatment. If Pap tests have been discontinued for you, your risk factors (such as having a new sexual partner) need to be  reassessed to determine if you should start having screenings again. Some women have medical problems that increase the chance of getting cervical cancer. In these cases, your health care provider may recommend that you have screening and Pap tests more often.  If you have a family history of uterine cancer or ovarian cancer, talk with your health care provider about genetic screening.  If you have vaginal bleeding after reaching menopause, tell your health care provider.  There are currently no reliable tests available to screen for ovarian cancer.  Lung Cancer Lung cancer screening is recommended for adults 69-62 years old who are at high risk for lung cancer because of a history of smoking. A yearly low-dose CT scan of the lungs is recommended if you:  Currently smoke.  Have a history of at least 30 pack-years of smoking and you currently smoke or have quit within the past 15 years. A pack-year is smoking an average of one pack of cigarettes per day for one year.  Yearly screening should:  Continue until it has been 15 years since you quit.  Stop if you develop a health problem that would prevent you from having lung cancer treatment.  Colorectal Cancer  This type of cancer can be detected and can often be prevented.  Routine colorectal cancer screening usually begins at  age 42 and continues through age 45.  If you have risk factors for colon cancer, your health care provider may recommend that you be screened at an earlier age.  If you have a family history of colorectal cancer, talk with your health care provider about genetic screening.  Your health care provider may also recommend using home test kits to check for hidden blood in your stool.  A small camera at the end of a tube can be used to examine your colon directly (sigmoidoscopy or colonoscopy). This is done to check for the earliest forms of colorectal cancer.  Direct examination of the colon should be repeated every  5-10 years until age 71. However, if early forms of precancerous polyps or small growths are found or if you have a family history or genetic risk for colorectal cancer, you may need to be screened more often.  Skin Cancer  Check your skin from head to toe regularly.  Monitor any moles. Be sure to tell your health care provider: ? About any new moles or changes in moles, especially if there is a change in a mole's shape or color. ? If you have a mole that is larger than the size of a pencil eraser.  If any of your family members has a history of skin cancer, especially at a young age, talk with your health care provider about genetic screening.  Always use sunscreen. Apply sunscreen liberally and repeatedly throughout the day.  Whenever you are outside, protect yourself by wearing long sleeves, pants, a wide-brimmed hat, and sunglasses.  What should I know about osteoporosis? Osteoporosis is a condition in which bone destruction happens more quickly than new bone creation. After menopause, you may be at an increased risk for osteoporosis. To help prevent osteoporosis or the bone fractures that can happen because of osteoporosis, the following is recommended:  If you are 46-71 years old, get at least 1,000 mg of calcium and at least 600 mg of vitamin D per day.  If you are older than age 55 but younger than age 65, get at least 1,200 mg of calcium and at least 600 mg of vitamin D per day.  If you are older than age 54, get at least 1,200 mg of calcium and at least 800 mg of vitamin D per day.  Smoking and excessive alcohol intake increase the risk of osteoporosis. Eat foods that are rich in calcium and vitamin D, and do weight-bearing exercises several times each week as directed by your health care provider. What should I know about how menopause affects my mental health? Depression may occur at any age, but it is more common as you become older. Common symptoms of depression  include:  Low or sad mood.  Changes in sleep patterns.  Changes in appetite or eating patterns.  Feeling an overall lack of motivation or enjoyment of activities that you previously enjoyed.  Frequent crying spells.  Talk with your health care provider if you think that you are experiencing depression. What should I know about immunizations? It is important that you get and maintain your immunizations. These include:  Tetanus, diphtheria, and pertussis (Tdap) booster vaccine.  Influenza every year before the flu season begins.  Pneumonia vaccine.  Shingles vaccine.  Your health care provider may also recommend other immunizations. This information is not intended to replace advice given to you by your health care provider. Make sure you discuss any questions you have with your health care provider. Document Released: 10/07/2005  Document Revised: 03/04/2016 Document Reviewed: 05/19/2015 Elsevier Interactive Patient Education  2018 Elsevier Inc.  

## 2017-09-11 NOTE — Progress Notes (Addendum)
HPI:  Pt presents to the clinic today for her Welcome to Medicare Exam.   Past Medical History:  Diagnosis Date  . Arthritis   . Chicken pox     Current Outpatient Medications  Medication Sig Dispense Refill  . clobetasol ointment (TEMOVATE) 3.55 % Apply 1 application topically 2 (two) times daily. 30 g 0  . diphenhydrAMINE (BENADRYL) 25 MG tablet Take 25 mg by mouth at bedtime as needed.    . nitrofurantoin, macrocrystal-monohydrate, (MACROBID) 100 MG capsule Take 1 capsule (100 mg total) by mouth 2 (two) times daily. 10 capsule 0   No current facility-administered medications for this visit.     No Known Allergies  Family History  Problem Relation Age of Onset  . Cancer Mother        Breast  . Breast cancer Mother 88  . Alcohol abuse Father   . Cancer Father        Lung and Prostate  . Arthritis Son     Social History   Socioeconomic History  . Marital status: Married    Spouse name: Not on file  . Number of children: Not on file  . Years of education: Not on file  . Highest education level: Not on file  Social Needs  . Financial resource strain: Not on file  . Food insecurity - worry: Not on file  . Food insecurity - inability: Not on file  . Transportation needs - medical: Not on file  . Transportation needs - non-medical: Not on file  Occupational History  . Not on file  Tobacco Use  . Smoking status: Never Smoker  . Smokeless tobacco: Never Used  Substance and Sexual Activity  . Alcohol use: Yes    Alcohol/week: 0.0 oz    Comment: occasional  . Drug use: No  . Sexual activity: Not on file  Other Topics Concern  . Not on file  Social History Narrative  . Not on file    Hospitiliaztions: None  Health Maintenance:    Flu: 09/2016  Tetanus: 08/2014  Pneumovax: never  Prevnar: never  Zostavax: 08/2014  Mammogram: 10/2016  Pap Smear: 08/2016  Bone Density: never  Colon Screening: 09/2014  Eye Doctor: as needed  Dental Exam:  biannually   Providers:   PCP: Webb Silversmith, NP-C  Dermatologist: [facility]    I have personally reviewed and have noted:  1. The patient's medical and social history 2. Their use of alcohol, tobacco or illicit drugs 3. Their current medications and supplements 4. The patient's functional ability including ADL's, fall risks, home safety risks and hearing or visual impairment. 5. Diet and physical activities 6. Evidence for depression or mood disorder  Subjective:   Review of Systems:   Constitutional: Denies fever, malaise, fatigue, headache or abrupt weight changes.  HEENT: Denies eye pain, eye redness, ear pain, ringing in the ears, wax buildup, runny nose, nasal congestion, bloody nose, or sore throat. Respiratory: Denies difficulty breathing, shortness of breath, cough or sputum production.   Cardiovascular: Denies chest pain, chest tightness, palpitations or swelling in the hands or feet.  Gastrointestinal: Denies abdominal pain, bloating, constipation, diarrhea or blood in the stool.  GU: Denies urgency, frequency, pain with urination, burning sensation, blood in urine, odor or discharge. Musculoskeletal: Denies decrease in range of motion, difficulty with gait, muscle pain or joint pain and swelling.  Skin: Denies redness, rashes, lesions or ulcercations.  Neurological: Pt reports insomnia. Denies dizziness, difficulty with memory, difficulty with speech or problems  with balance and coordination.  Psych: Denies anxiety, depression, SI/HI.  No other specific complaints in a complete review of systems (except as listed in HPI above).  Objective:  PE:   BP 122/78   Pulse 81   Temp 98 F (36.7 C) (Oral)   Ht 5\' 3"  (1.6 m)   Wt 135 lb (61.2 kg)   SpO2 98%   BMI 23.91 kg/m   Wt Readings from Last 3 Encounters:  12/19/16 136 lb (61.7 kg)  10/03/16 133 lb 12 oz (60.7 kg)  09/23/16 135 lb (61.2 kg)    General: Appears her stated age, well developed,  well nourished in NAD. Skin: Warm, dry and intact.  HEENT: Head: normal shape and size; Eyes: sclera white, no icterus, conjunctiva pink, PERRLA and EOMs intact; Ears: Tm's gray and intact, normal light reflex; Throat/Mouth: Teeth present, mucosa pink and moist, no exudate, lesions or ulcerations noted.  Neck: Neck supple, trachea midline. No masses, lumps or thyromegaly present.  Cardiovascular: Normal rate and rhythm. S1,S2 noted.  No murmur, rubs or gallops noted. No JVD or BLE edema. No carotid bruits noted. Pulmonary/Chest: Normal effort and positive vesicular breath sounds. No respiratory distress. No wheezes, rales or ronchi noted.  Abdomen: Soft and nontender. Normal bowel sounds. No distention or masses noted. Liver, spleen and kidneys non palpable. Musculoskeletal:  Strength 5/5 BUE/BLE. No signs of joint swelling.  Neurological: Alert and oriented. Cranial nerves II-XII grossly intact. Coordination normal.  Psychiatric: Mood and affect normal. Behavior is normal. Judgment and thought content normal.     BMET    Component Value Date/Time   NA 140 09/12/2016 0759   K 4.9 09/12/2016 0759   CL 103 09/12/2016 0759   CO2 30 09/12/2016 0759   GLUCOSE 105 (H) 09/12/2016 0759   BUN 11 09/12/2016 0759   CREATININE 0.70 09/12/2016 0759   CALCIUM 9.5 09/12/2016 0759    Lipid Panel     Component Value Date/Time   CHOL 162 09/12/2016 0759   TRIG 102 09/12/2016 0759   HDL 62 09/12/2016 0759   CHOLHDL 2.6 09/12/2016 0759   VLDL 20 09/12/2016 0759   LDLCALC 80 09/12/2016 0759    CBC    Component Value Date/Time   WBC 4.9 09/12/2016 0759   RBC 4.16 09/12/2016 0759   HGB 12.8 09/12/2016 0759   HCT 39.4 09/12/2016 0759   PLT 243 09/12/2016 0759   MCV 94.7 09/12/2016 0759   MCH 30.8 09/12/2016 0759   MCHC 32.5 09/12/2016 0759   RDW 12.6 09/12/2016 0759    Hgb A1C Lab Results  Component Value Date   HGBA1C 5.7 (H) 09/12/2016      Assessment and Plan:   Medicare  Annual Wellness Visit:  Diet: She does eat meat. She consumes fruits and veggies daily. She does eat fried foods. She drinks mostly water and sweet tea. Physical activity: Walks 3 miles daily. Depression/mood screen: Negative Hearing: Intact to whispered voice Visual acuity: Grossly normal ADLs: Capable Fall risk: None Home safety: Good Cognitive evaluation: Intact to orientation, naming, recall and repetition EOL planning: No adv directives, full code/ I agree  Preventative Medicine: Flu and prevnar today. Pneumovax at next visit. Tetanus UTD. Zostovax UTD. She declines Shingrix. Pap smear UTD. She will call to schedule her mammogram. Bone density ordered. Colon screening UTD. Encouraged her to see an eye doctor and dentist annually. Advised her to consume a balanced diet and exercise regimen. Will check CBC, CMET, Lipid and Vit D today.  ECG today normal.   Next appointment: 1 year Medicare Wellness Exam   Webb Silversmith, NP

## 2017-09-12 ENCOUNTER — Ambulatory Visit (INDEPENDENT_AMBULATORY_CARE_PROVIDER_SITE_OTHER): Payer: Medicare Other

## 2017-09-12 ENCOUNTER — Encounter: Payer: Self-pay | Admitting: Podiatry

## 2017-09-12 ENCOUNTER — Other Ambulatory Visit: Payer: Self-pay | Admitting: Podiatry

## 2017-09-12 ENCOUNTER — Ambulatory Visit (INDEPENDENT_AMBULATORY_CARE_PROVIDER_SITE_OTHER): Payer: Medicare Other | Admitting: Podiatry

## 2017-09-12 DIAGNOSIS — M79673 Pain in unspecified foot: Secondary | ICD-10-CM

## 2017-09-12 DIAGNOSIS — G5790 Unspecified mononeuropathy of unspecified lower limb: Secondary | ICD-10-CM

## 2017-09-12 DIAGNOSIS — M792 Neuralgia and neuritis, unspecified: Secondary | ICD-10-CM

## 2017-09-12 LAB — CBC
HCT: 40.2 % (ref 36.0–46.0)
Hemoglobin: 13 g/dL (ref 12.0–15.0)
MCHC: 32.2 g/dL (ref 30.0–36.0)
MCV: 97.8 fl (ref 78.0–100.0)
Platelets: 230 10*3/uL (ref 150.0–400.0)
RBC: 4.11 Mil/uL (ref 3.87–5.11)
RDW: 12.5 % (ref 11.5–15.5)
WBC: 6.9 10*3/uL (ref 4.0–10.5)

## 2017-09-12 LAB — LIPID PANEL
Cholesterol: 153 mg/dL (ref 0–200)
HDL: 60.8 mg/dL (ref >39.00)
LDL Cholesterol: 71 mg/dL (ref 0–99)
NONHDL: 91.82
Total CHOL/HDL Ratio: 3
Triglycerides: 106 mg/dL (ref 0.0–149.0)
VLDL: 21.2 mg/dL (ref 0.0–40.0)

## 2017-09-12 LAB — COMPREHENSIVE METABOLIC PANEL
ALT: 10 U/L (ref 0–35)
AST: 16 U/L (ref 0–37)
Albumin: 4.2 g/dL (ref 3.5–5.2)
Alkaline Phosphatase: 70 U/L (ref 39–117)
BILIRUBIN TOTAL: 0.3 mg/dL (ref 0.2–1.2)
BUN: 15 mg/dL (ref 6–23)
CO2: 32 mEq/L (ref 19–32)
Calcium: 9.4 mg/dL (ref 8.4–10.5)
Chloride: 97 mEq/L (ref 96–112)
Creatinine, Ser: 0.73 mg/dL (ref 0.40–1.20)
GFR: 84.9 mL/min (ref >60.00)
GLUCOSE: 100 mg/dL — AB (ref 70–99)
Potassium: 4.8 mEq/L (ref 3.5–5.1)
SODIUM: 135 meq/L (ref 135–145)
TOTAL PROTEIN: 7.2 g/dL (ref 6.0–8.3)

## 2017-09-12 LAB — VITAMIN D 25 HYDROXY (VIT D DEFICIENCY, FRACTURES): VITD: 48.31 ng/mL (ref 30.00–100.00)

## 2017-09-12 NOTE — Progress Notes (Signed)
   Subjective:    Patient ID: Caitlyn Martinez, female    DOB: 06/17/1951, 67 y.o.   MRN: [id number]  HPI    Review of Systems     Objective:   Physical Exam        Assessment & Plan:

## 2017-09-13 ENCOUNTER — Telehealth: Payer: Self-pay | Admitting: Internal Medicine

## 2017-09-13 NOTE — Telephone Encounter (Signed)
That was sent in 2 days ago.

## 2017-09-13 NOTE — Telephone Encounter (Signed)
Copied from [place] [id number]. Topic: Quick Communication - See Telephone Encounter >> Sep 13, 2017 12:39 PM Conception Chancy, NT wrote: CRM for notification. See Telephone encounter for:  09/13/17.  Patient would like a generic version of Clobetasol ointment called into her [facility]. Please advise

## 2017-09-14 NOTE — Progress Notes (Signed)
   HPI: 67 year old female presenting today as a new patient with a chief complaint of intermittent shooting pain and tingling to the 3rd, 4th and 5th toes of the bilateral feet that has been ongoing for several weeks. She states the symptoms are worse in the left foot than the right. Wearing shoes causes the pain while not wearing them helps alleviate it. She has been rubbing the toes for treatment. Patient is here for further evaluation and treatment.   Past Medical History:  Diagnosis Date  . Arthritis   . Chicken pox      Physical Exam: General: The patient is alert and oriented x3 in no acute distress.  Dermatology: Skin is warm, dry and supple bilateral lower extremities. Negative for open lesions or macerations.  Vascular: Palpable pedal pulses bilaterally. No edema or erythema noted. Capillary refill within normal limits.  Neurological: Epicritic and protective threshold grossly intact bilaterally.   Musculoskeletal Exam: Range of motion within normal limits to all pedal and ankle joints bilateral. Muscle strength 5/5 in all groups bilateral.   Radiographic Exam:  Normal osseous mineralization. Joint spaces preserved. No fracture/dislocation/boney destruction.    Assessment: - Intermittent neuritis bilateral forefeet secondary to shoe gear   Plan of Care:  - Patient evaluated. X-Rays reviewed. - Recommended wide fitting sneakers that do not compress the forefoot. - Recommended OTC Motrin as needed. - Return to clinic as needed.   Edrick Kins, DPM [facility]  Dr. Edrick Kins, DPM    [address],  [address]                Office [phone]  Fax [phone]

## 2017-09-19 ENCOUNTER — Telehealth: Payer: Self-pay | Admitting: Internal Medicine

## 2017-09-19 NOTE — Telephone Encounter (Signed)
Spoke to pt and answered questions

## 2017-09-19 NOTE — Telephone Encounter (Signed)
Returning call.

## 2017-09-19 NOTE — Addendum Note (Signed)
Addended by: Lurlean Nanny on: 09/19/2017 01:29 PM   Modules accepted: Orders

## 2017-09-19 NOTE — Telephone Encounter (Signed)
Left v/m requesting pt to cb about lab results.

## 2017-09-19 NOTE — Telephone Encounter (Signed)
Copied from [place]. Topic: Quick Communication - See Telephone Encounter >> Sep 19, 2017 11:10 AM Bea Graff, NT wrote: CRM for notification. See Telephone encounter for: Pt has some questions regarding her labs results on 09/11/17. Please call pt  09/19/17.

## 2017-09-19 NOTE — Telephone Encounter (Signed)
I called the pharmacy and they stated it is not whether it is name brand or generic, after insurance cost for pt is over $100--- please advise

## 2017-09-19 NOTE — Telephone Encounter (Signed)
Pt calling again today and states that the Clobetasol was to expensive and she would like a generic version called in to [facility]. She can;t afford the one that was called in. Please advise.Per pt, pharmacy stated they contacted the office as well, no response.

## 2017-09-19 NOTE — Telephone Encounter (Signed)
I spoke to pt and she told me that the pharmacy advised her that Betamethasone would only be $4 for her if this an ointment that could be substituted... Please advise

## 2017-09-20 MED ORDER — BETAMETHASONE VALERATE 0.1 % EX OINT: 1.0000 "application " | TOPICAL_OINTMENT | Freq: Two times a day (BID) | CUTANEOUS | 0 refills | Status: DC

## 2017-09-20 NOTE — Telephone Encounter (Signed)
Betamethasone sent to pharmacy

## 2017-09-20 NOTE — Addendum Note (Signed)
Addended by: Jearld Fenton on: 09/20/2017 12:13 PM   Modules accepted: Orders

## 2017-09-25 DIAGNOSIS — L821 Other seborrheic keratosis: Secondary | ICD-10-CM | POA: Diagnosis not present

## 2017-09-25 DIAGNOSIS — L538 Other specified erythematous conditions: Secondary | ICD-10-CM | POA: Diagnosis not present

## 2017-09-25 DIAGNOSIS — R208 Other disturbances of skin sensation: Secondary | ICD-10-CM | POA: Diagnosis not present

## 2017-09-25 DIAGNOSIS — L820 Inflamed seborrheic keratosis: Secondary | ICD-10-CM | POA: Diagnosis not present

## 2017-10-06 ENCOUNTER — Other Ambulatory Visit: Payer: Self-pay | Admitting: Internal Medicine

## 2017-10-06 DIAGNOSIS — Z1231 Encounter for screening mammogram for malignant neoplasm of breast: Secondary | ICD-10-CM

## 2017-11-15 ENCOUNTER — Ambulatory Visit
Admission: RE | Admit: 2017-11-15 | Discharge: 2017-11-15 | Disposition: A | Discharge status: Home / Self Care | Payer: Medicare Other | Source: Ambulatory Visit | Attending: Internal Medicine | Admitting: Internal Medicine

## 2017-11-15 DIAGNOSIS — Z780 Asymptomatic menopausal state: Secondary | ICD-10-CM

## 2017-11-15 DIAGNOSIS — Z13820 Encounter for screening for osteoporosis: Secondary | ICD-10-CM | POA: Diagnosis not present

## 2017-11-15 DIAGNOSIS — M810 Age-related osteoporosis without current pathological fracture: Secondary | ICD-10-CM | POA: Insufficient documentation

## 2017-11-15 DIAGNOSIS — Z1231 Encounter for screening mammogram for malignant neoplasm of breast: Secondary | ICD-10-CM | POA: Diagnosis not present

## 2017-11-16 ENCOUNTER — Other Ambulatory Visit: Payer: Medicare Other

## 2017-11-16 ENCOUNTER — Other Ambulatory Visit: Payer: Self-pay | Admitting: Internal Medicine

## 2017-11-16 DIAGNOSIS — Z86010 Personal history of colonic polyps: Secondary | ICD-10-CM | POA: Diagnosis not present

## 2017-11-16 DIAGNOSIS — Z8371 Family history of colonic polyps: Secondary | ICD-10-CM | POA: Diagnosis not present

## 2017-11-16 DIAGNOSIS — N6489 Other specified disorders of breast: Secondary | ICD-10-CM

## 2017-11-16 DIAGNOSIS — K5730 Diverticulosis of large intestine without perforation or abscess without bleeding: Secondary | ICD-10-CM | POA: Diagnosis not present

## 2017-11-16 DIAGNOSIS — R928 Other abnormal and inconclusive findings on diagnostic imaging of breast: Secondary | ICD-10-CM

## 2017-11-16 DIAGNOSIS — K640 First degree hemorrhoids: Secondary | ICD-10-CM | POA: Diagnosis not present

## 2017-11-16 DIAGNOSIS — K648 Other hemorrhoids: Secondary | ICD-10-CM | POA: Diagnosis not present

## 2017-12-11 ENCOUNTER — Ambulatory Visit
Admission: RE | Admit: 2017-12-11 | Discharge: 2017-12-11 | Disposition: A | Discharge status: Home / Self Care | Payer: Medicare Other | Source: Ambulatory Visit | Attending: Internal Medicine | Admitting: Internal Medicine

## 2017-12-11 DIAGNOSIS — R928 Other abnormal and inconclusive findings on diagnostic imaging of breast: Secondary | ICD-10-CM

## 2017-12-11 DIAGNOSIS — N6489 Other specified disorders of breast: Secondary | ICD-10-CM | POA: Insufficient documentation

## 2017-12-11 DIAGNOSIS — N6011 Diffuse cystic mastopathy of right breast: Secondary | ICD-10-CM | POA: Diagnosis not present

## 2017-12-11 DIAGNOSIS — N6001 Solitary cyst of right breast: Secondary | ICD-10-CM | POA: Diagnosis not present

## 2018-06-07 DIAGNOSIS — Z23 Encounter for immunization: Secondary | ICD-10-CM | POA: Diagnosis not present

## 2018-09-04 DIAGNOSIS — D225 Melanocytic nevi of trunk: Secondary | ICD-10-CM | POA: Diagnosis not present

## 2018-09-04 DIAGNOSIS — X32XXXA Exposure to sunlight, initial encounter: Secondary | ICD-10-CM | POA: Diagnosis not present

## 2018-09-04 DIAGNOSIS — D2272 Melanocytic nevi of left lower limb, including hip: Secondary | ICD-10-CM | POA: Diagnosis not present

## 2018-09-04 DIAGNOSIS — D2261 Melanocytic nevi of right upper limb, including shoulder: Secondary | ICD-10-CM | POA: Diagnosis not present

## 2018-09-04 DIAGNOSIS — L570 Actinic keratosis: Secondary | ICD-10-CM | POA: Diagnosis not present

## 2018-09-04 DIAGNOSIS — L821 Other seborrheic keratosis: Secondary | ICD-10-CM | POA: Diagnosis not present

## 2018-09-04 DIAGNOSIS — D485 Neoplasm of uncertain behavior of skin: Secondary | ICD-10-CM | POA: Diagnosis not present

## 2018-09-04 DIAGNOSIS — D2271 Melanocytic nevi of right lower limb, including hip: Secondary | ICD-10-CM | POA: Diagnosis not present

## 2018-09-04 DIAGNOSIS — D2262 Melanocytic nevi of left upper limb, including shoulder: Secondary | ICD-10-CM | POA: Diagnosis not present

## 2018-09-17 ENCOUNTER — Ambulatory Visit (INDEPENDENT_AMBULATORY_CARE_PROVIDER_SITE_OTHER): Payer: Medicare Other | Admitting: Internal Medicine

## 2018-09-17 ENCOUNTER — Encounter: Payer: Self-pay | Admitting: Internal Medicine

## 2018-09-17 VITALS — BP 124/78 | HR 77 | Temp 97.9°F | Ht 63.5 in | Wt 137.0 lb

## 2018-09-17 DIAGNOSIS — Z23 Encounter for immunization: Secondary | ICD-10-CM

## 2018-09-17 DIAGNOSIS — Z0000 Encounter for general adult medical examination without abnormal findings: Secondary | ICD-10-CM | POA: Diagnosis not present

## 2018-09-17 DIAGNOSIS — M810 Age-related osteoporosis without current pathological fracture: Secondary | ICD-10-CM | POA: Diagnosis not present

## 2018-09-17 LAB — COMPREHENSIVE METABOLIC PANEL
ALBUMIN: 4.3 g/dL (ref 3.5–5.2)
ALK PHOS: 110 U/L (ref 39–117)
ALT: 11 U/L (ref 0–35)
AST: 16 U/L (ref 0–37)
BUN: 12 mg/dL (ref 6–23)
CO2: 30 mEq/L (ref 19–32)
Calcium: 9.6 mg/dL (ref 8.4–10.5)
Chloride: 100 mEq/L (ref 96–112)
Creatinine, Ser: 0.79 mg/dL (ref 0.40–1.20)
GFR: 72.69 mL/min (ref >60.00)
Glucose, Bld: 94 mg/dL (ref 70–99)
POTASSIUM: 4.7 meq/L (ref 3.5–5.1)
SODIUM: 138 meq/L (ref 135–145)
TOTAL PROTEIN: 7.1 g/dL (ref 6.0–8.3)
Total Bilirubin: 0.4 mg/dL (ref 0.2–1.2)

## 2018-09-17 LAB — LIPID PANEL
CHOLESTEROL: 159 mg/dL (ref 0–200)
HDL: 59.7 mg/dL (ref >39.00)
LDL Cholesterol: 84 mg/dL (ref 0–99)
NonHDL: 99.18
Total CHOL/HDL Ratio: 3
Triglycerides: 76 mg/dL (ref 0.0–149.0)
VLDL: 15.2 mg/dL (ref 0.0–40.0)

## 2018-09-17 LAB — VITAMIN D 25 HYDROXY (VIT D DEFICIENCY, FRACTURES): VITD: 49.92 ng/mL (ref 30.00–100.00)

## 2018-09-17 MED ORDER — FLUTICASONE PROPIONATE 50 MCG/ACT NA SUSP: 2.0000 | Freq: Every day | NASAL | 6 refills | Status: DC

## 2018-09-17 NOTE — Addendum Note (Signed)
Addended by: Lurlean Nanny on: 09/17/2018 09:03 AM   Modules accepted: Orders

## 2018-09-17 NOTE — Progress Notes (Signed)
HPI:  Patient presents to the clinic today for her Medicare Wellness Exam.  Past Medical History:  Diagnosis Date  . Arthritis   . Chicken pox     Current Outpatient Medications  Medication Sig Dispense Refill  . betamethasone valerate ointment (VALISONE) 0.1 % Apply 1 application topically 2 (two) times daily. 30 g 0  . diphenhydrAMINE (BENADRYL) 25 mg capsule Take by mouth.    . fluticasone (FLONASE) 50 MCG/ACT nasal spray Place 2 sprays into both nostrils daily. 16 g 6   No current facility-administered medications for this visit.     No Known Allergies  Family History  Problem Relation Age of Onset  . Cancer Mother        Breast  . Breast cancer Mother 37  . Alcohol abuse Father   . Cancer Father        Lung and Prostate  . Arthritis Son     Social History   Socioeconomic History  . Marital status: Married    Spouse name: Not on file  . Number of children: Not on file  . Years of education: Not on file  . Highest education level: Not on file  Occupational History  . Not on file  Social Needs  . Financial resource strain: Not on file  . Food insecurity:    Worry: Not on file    Inability: Not on file  . Transportation needs:    Medical: Not on file    Non-medical: Not on file  Tobacco Use  . Smoking status: Never Smoker  . Smokeless tobacco: Never Used  Substance and Sexual Activity  . Alcohol use: Yes    Alcohol/week: 0.0 standard drinks    Comment: occasional  . Drug use: No  . Sexual activity: Not on file  Lifestyle  . Physical activity:    Days per week: Not on file    Minutes per session: Not on file  . Stress: Not on file  Relationships  . Social connections:    Talks on phone: Not on file    Gets together: Not on file    Attends religious service: Not on file    Active member of club or organization: Not on file    Attends meetings of clubs or organizations: Not on file    Relationship status: Not on file  . Intimate partner violence:     Fear of current or ex partner: Not on file    Emotionally abused: Not on file    Physically abused: Not on file    Forced sexual activity: Not on file  Other Topics Concern  . Not on file  Social History Narrative  . Not on file    Hospitiliaztions: None  Health Maintenance:    Flu: 06/2018  Tetanus: 08/2014  Pneumovax: Never  Prevnar: 08/2017  Zostavax: 08/2014  Mammogram: 11/2017  Pap Smear: 08/2016  Bone Density: 10/2017  Colon Screening: 09/2014  Eye Doctor: As needed  Dental Exam: Biannually   Providers:   PCP: Webb Silversmith, NP-C  Dermatologist: [facility]    I have personally reviewed and have noted:  1. The patient's medical and social history 2. Their use of alcohol, tobacco or illicit drugs 3. Their current medications and supplements 4. The patient's functional ability including ADL's, fall risks, home safety risks and hearing or visual impairment. 5. Diet and physical activities 6. Evidence for depression or mood disorder  Subjective:   Review of Systems:   Constitutional: Denies fever, malaise,  fatigue, headache or abrupt weight changes.  HEENT: Denies eye pain, eye redness, ear pain, ringing in the ears, wax buildup, runny nose, nasal congestion, bloody nose, or sore throat. Respiratory: Denies difficulty breathing, shortness of breath, cough or sputum production.   Cardiovascular: Denies chest pain, chest tightness, palpitations or swelling in the hands or feet.  Gastrointestinal: Pt reports constipation. Denies abdominal pain, bloating, diarrhea or blood in the stool.  GU: Denies urgency, frequency, pain with urination, burning sensation, blood in urine, odor or discharge. Musculoskeletal: Denies decrease in range of motion, difficulty with gait, muscle pain or joint pain and swelling.  Skin: Denies redness, rashes, lesions or ulcercations.  Neurological: Denies dizziness, difficulty with memory, difficulty with speech or problems with  balance and coordination.  Psych: Denies anxiety, depression, SI/HI.  No other specific complaints in a complete review of systems (except as listed in HPI above).  Objective:  PE:   BP 124/78   Pulse 77   Temp 97.9 F (36.6 C) (Oral)   Ht 5' 3.5" (1.613 m)   Wt 137 lb (62.1 kg)   SpO2 98%   BMI 23.89 kg/m   Wt Readings from Last 3 Encounters:  09/11/17 135 lb (61.2 kg)  12/19/16 136 lb (61.7 kg)  10/03/16 133 lb 12 oz (60.7 kg)    General: Appears her stated age, well developed, well nourished in NAD. Skin: Warm, dry and intact.  HEENT: Head: normal shape and size; Eyes: sclera white, no icterus, conjunctiva pink, PERRLA and EOMs intact; Ears: Tm's gray and intact, normal light reflex; Throat/Mouth: Teeth present, mucosa pink and moist, no exudate, lesions or ulcerations noted.  Neck: Neck supple, trachea midline. No masses, lumps or thyromegaly present.  Cardiovascular: Normal rate and rhythm. S1,S2 noted.  No murmur, rubs or gallops noted. No JVD or BLE edema. No carotid bruits noted. Pulmonary/Chest: Normal effort and positive vesicular breath sounds. No respiratory distress. No wheezes, rales or ronchi noted.  Abdomen: Soft and nontender. Normal bowel sounds. No distention or masses noted. Liver, spleen and kidneys non palpable. Musculoskeletal: Strength 5/5 BUE/BLE. No signs of joint swelling.  Neurological: Alert and oriented. Cranial nerves II-XII grossly intact. Coordination normal.  Psychiatric: Mood and affect normal. Behavior is normal. Judgment and thought content normal.    BMET    Component Value Date/Time   NA 135 09/11/2017 1523   K 4.8 09/11/2017 1523   CL 97 09/11/2017 1523   CO2 32 09/11/2017 1523   GLUCOSE 100 (H) 09/11/2017 1523   BUN 15 09/11/2017 1523   CREATININE 0.73 09/11/2017 1523   CREATININE 0.70 09/12/2016 0759   CALCIUM 9.4 09/11/2017 1523    Lipid Panel     Component Value Date/Time   CHOL 153 09/11/2017 1523   TRIG 106.0  09/11/2017 1523   HDL 60.80 09/11/2017 1523   CHOLHDL 3 09/11/2017 1523   VLDL 21.2 09/11/2017 1523   LDLCALC 71 09/11/2017 1523    CBC    Component Value Date/Time   WBC 6.9 09/11/2017 1523   RBC 4.11 09/11/2017 1523   HGB 13.0 09/11/2017 1523   HCT 40.2 09/11/2017 1523   PLT 230.0 09/11/2017 1523   MCV 97.8 09/11/2017 1523   MCH 30.8 09/12/2016 0759   MCHC 32.2 09/11/2017 1523   RDW 12.5 09/11/2017 1523    Hgb A1C Lab Results  Component Value Date   HGBA1C 5.7 (H) 09/12/2016      Assessment and Plan:   Medicare Annual Wellness Visit:  Diet:  She does eat meat.  She consumes fruits and veggies daily.  She does eat fried foods.  She drinks mostly water and sweet tea. Physical activity: Walks 3 miles daily. Depression/mood screen: Negative Hearing: Intact to whispered voice Visual acuity: Grossly normal, performs annual eye exam  ADLs: Capable Fall risk: None Home safety: Good Cognitive evaluation: Intact to orientation, naming, recall and repetition EOL planning:  No adv directives, full code/ I agree  Preventative Medicine: Flu, tetanus, prevnar and zostovax UTD. Pneumovax today. She will get the Shingrix at her pharmacy. Mammogram, pap smear and bone density UTD. Colon screening UTD. Encouraged her to consume a balanced diet and exercise regimen. Advised her to see an eye doctor and dentist annually. Will check CMET, Lipid and Vit D today.   Next appointment: 1 year, Medicare Wellness Exam   Webb Silversmith, NP

## 2018-09-17 NOTE — Assessment & Plan Note (Signed)
She declines treatment at this time Advised her to start Calcium and Vit D daily Encouraged daily weight bearing exercise

## 2018-09-17 NOTE — Patient Instructions (Signed)
Health Maintenance After Age 67 After age 67, you are at a higher risk for certain long-term diseases and infections as well as injuries from falls. Falls are a major cause of broken bones and head injuries in people who are older than age 67. Getting regular preventive care can help to keep you healthy and well. Preventive care includes getting regular testing and making lifestyle changes as recommended by your health care provider. Talk with your health care provider about:  Which screenings and tests you should have. A screening is a test that checks for a disease when you have no symptoms.  A diet and exercise plan that is right for you. What should I know about screenings and tests to prevent falls? Screening and testing are the best ways to find a health problem early. Early diagnosis and treatment give you the best chance of managing medical conditions that are common after age 67. Certain conditions and lifestyle choices may make you more likely to have a fall. Your health care provider may recommend:  Regular vision checks. Poor vision and conditions such as cataracts can make you more likely to have a fall. If you wear glasses, make sure to get your prescription updated if your vision changes.  Medicine review. Work with your health care provider to regularly review all of the medicines you are taking, including over-the-counter medicines. Ask your health care provider about any side effects that may make you more likely to have a fall. Tell your health care provider if any medicines that you take make you feel dizzy or sleepy.  Osteoporosis screening. Osteoporosis is a condition that causes the bones to get weaker. This can make the bones weak and cause them to break more easily.  Blood pressure screening. Blood pressure changes and medicines to control blood pressure can make you feel dizzy.  Strength and balance checks. Your health care provider may recommend certain tests to check your  strength and balance while standing, walking, or changing positions.  Foot health exam. Foot pain and numbness, as well as not wearing proper footwear, can make you more likely to have a fall.  Depression screening. You may be more likely to have a fall if you have a fear of falling, feel emotionally low, or feel unable to do activities that you used to do.  Alcohol use screening. Using too much alcohol can affect your balance and may make you more likely to have a fall. What actions can I take to lower my risk of falls? General instructions  Talk with your health care provider about your risks for falling. Tell your health care provider if: ? You fall. Be sure to tell your health care provider about all falls, even ones that seem minor. ? You feel dizzy, sleepy, or off-balance.  Take over-the-counter and prescription medicines only as told by your health care provider. These include any supplements.  Eat a healthy diet and maintain a healthy weight. A healthy diet includes low-fat dairy products, low-fat (lean) meats, and fiber from whole grains, beans, and lots of fruits and vegetables. Home safety  Remove any tripping hazards, such as rugs, cords, and clutter.  Install safety equipment such as grab bars in bathrooms and safety rails on stairs.  Keep rooms and walkways well-lit. Activity   Follow a regular exercise program to stay fit. This will help you maintain your balance. Ask your health care provider what types of exercise are appropriate for you.  If you need a cane or   walker, use it as recommended by your health care provider.  Wear supportive shoes that have nonskid soles. Lifestyle  Do not drink alcohol if your health care provider tells you not to drink.  If you drink alcohol, limit how much you have: ? 0-1 drink a day for women. ? 0-2 drinks a day for men.  Be aware of how much alcohol is in your drink. In the [place] one drink equals one typical bottle of beer (12  oz), one-half glass of wine (5 oz), or one shot of hard liquor (1 oz).  Do not use any products that contain nicotine or tobacco, such as cigarettes and e-cigarettes. If you need help quitting, ask your health care provider. Summary  Having a healthy lifestyle and getting preventive care can help to protect your health and wellness after age 67.  Screening and testing are the best way to find a health problem early and help you avoid having a fall. Early diagnosis and treatment give you the best chance for managing medical conditions that are more common for people who are older than age 67.  Falls are a major cause of broken bones and head injuries in people who are older than age 67. Take precautions to prevent a fall at home.  Work with your health care provider to learn what changes you can make to improve your health and wellness and to prevent falls. This information is not intended to replace advice given to you by your health care provider. Make sure you discuss any questions you have with your health care provider. Document Released: 06/28/2017 Document Revised: 06/28/2017 Document Reviewed: 06/28/2017 Elsevier Interactive Patient Education  2019 [employer] Inc[employer]  

## 2018-09-18 DIAGNOSIS — L570 Actinic keratosis: Secondary | ICD-10-CM | POA: Diagnosis not present

## 2018-09-18 DIAGNOSIS — X32XXXA Exposure to sunlight, initial encounter: Secondary | ICD-10-CM | POA: Diagnosis not present

## 2018-10-30 ENCOUNTER — Other Ambulatory Visit: Payer: Self-pay | Admitting: Internal Medicine

## 2018-10-30 DIAGNOSIS — Z1231 Encounter for screening mammogram for malignant neoplasm of breast: Secondary | ICD-10-CM

## 2019-02-16 DIAGNOSIS — Z6822 Body mass index (BMI) 22.0-22.9, adult: Secondary | ICD-10-CM | POA: Diagnosis not present

## 2019-02-16 DIAGNOSIS — B029 Zoster without complications: Secondary | ICD-10-CM | POA: Diagnosis not present

## 2019-02-16 DIAGNOSIS — B0239 Other herpes zoster eye disease: Secondary | ICD-10-CM | POA: Diagnosis not present

## 2019-03-12 DIAGNOSIS — L570 Actinic keratosis: Secondary | ICD-10-CM | POA: Diagnosis not present

## 2019-04-03 ENCOUNTER — Ambulatory Visit
Admission: RE | Admit: 2019-04-03 | Discharge: 2019-04-03 | Disposition: A | Discharge status: Home / Self Care | Payer: Medicare Other | Source: Ambulatory Visit | Attending: Internal Medicine | Admitting: Internal Medicine

## 2019-04-03 DIAGNOSIS — Z1231 Encounter for screening mammogram for malignant neoplasm of breast: Secondary | ICD-10-CM | POA: Diagnosis not present

## 2019-04-03 DIAGNOSIS — H2513 Age-related nuclear cataract, bilateral: Secondary | ICD-10-CM | POA: Diagnosis not present

## 2019-05-11 DIAGNOSIS — Z23 Encounter for immunization: Secondary | ICD-10-CM | POA: Diagnosis not present

## 2019-07-08 ENCOUNTER — Telehealth: Payer: Self-pay | Admitting: Internal Medicine

## 2019-07-08 NOTE — Telephone Encounter (Signed)
Patient is interested in the Shingrix shot.  Patient called her insurance and they told her she needs to go to the pharmacy.  Patient needs an order to take the pharmacy.  Patient said she'll get it done at [facility], [employer].

## 2019-07-09 MED ORDER — ZOSTER VAC RECOMB ADJUVANTED 50 MCG/0.5ML IM SUSR: 0.5000 mL | Freq: Once | INTRAMUSCULAR | 0 refills | Status: AC

## 2019-07-09 NOTE — Telephone Encounter (Signed)
RX for Shingrix vaccine sent to pharmacy

## 2019-07-09 NOTE — Addendum Note (Signed)
Addended by: Jearld Fenton on: 07/09/2019 08:03 AM   Modules accepted: Orders

## 2019-07-10 NOTE — Telephone Encounter (Signed)
Pt is aware that Rx has been sent to [facility]

## 2019-08-14 ENCOUNTER — Other Ambulatory Visit: Payer: Medicare Other

## 2019-09-09 DIAGNOSIS — X32XXXA Exposure to sunlight, initial encounter: Secondary | ICD-10-CM | POA: Diagnosis not present

## 2019-09-09 DIAGNOSIS — L438 Other lichen planus: Secondary | ICD-10-CM | POA: Diagnosis not present

## 2019-09-09 DIAGNOSIS — D2272 Melanocytic nevi of left lower limb, including hip: Secondary | ICD-10-CM | POA: Diagnosis not present

## 2019-09-09 DIAGNOSIS — D225 Melanocytic nevi of trunk: Secondary | ICD-10-CM | POA: Diagnosis not present

## 2019-09-09 DIAGNOSIS — D485 Neoplasm of uncertain behavior of skin: Secondary | ICD-10-CM | POA: Diagnosis not present

## 2019-09-09 DIAGNOSIS — L570 Actinic keratosis: Secondary | ICD-10-CM | POA: Diagnosis not present

## 2019-09-23 ENCOUNTER — Encounter: Payer: Medicare Other | Admitting: Internal Medicine

## 2019-09-24 ENCOUNTER — Other Ambulatory Visit: Payer: Self-pay

## 2019-09-24 ENCOUNTER — Ambulatory Visit (INDEPENDENT_AMBULATORY_CARE_PROVIDER_SITE_OTHER): Payer: Medicare Other | Admitting: Internal Medicine

## 2019-09-24 ENCOUNTER — Encounter: Payer: Self-pay | Admitting: Internal Medicine

## 2019-09-24 VITALS — BP 124/76 | HR 74 | Temp 97.4°F | Ht 63.25 in | Wt 133.0 lb

## 2019-09-24 DIAGNOSIS — M810 Age-related osteoporosis without current pathological fracture: Secondary | ICD-10-CM

## 2019-09-24 DIAGNOSIS — Z0000 Encounter for general adult medical examination without abnormal findings: Secondary | ICD-10-CM

## 2019-09-24 LAB — COMPREHENSIVE METABOLIC PANEL
ALT: 11 U/L (ref 0–35)
AST: 18 U/L (ref 0–37)
Albumin: 4.2 g/dL (ref 3.5–5.2)
Alkaline Phosphatase: 70 U/L (ref 39–117)
BUN: 13 mg/dL (ref 6–23)
CO2: 30 mEq/L (ref 19–32)
Calcium: 9.4 mg/dL (ref 8.4–10.5)
Chloride: 100 mEq/L (ref 96–112)
Creatinine, Ser: 0.78 mg/dL (ref 0.40–1.20)
GFR: 73.54 mL/min (ref >60.00)
Glucose, Bld: 88 mg/dL (ref 70–99)
Potassium: 4.2 mEq/L (ref 3.5–5.1)
Sodium: 136 mEq/L (ref 135–145)
Total Bilirubin: 0.4 mg/dL (ref 0.2–1.2)
Total Protein: 6.9 g/dL (ref 6.0–8.3)

## 2019-09-24 LAB — LIPID PANEL
Cholesterol: 172 mg/dL (ref 0–200)
HDL: 63.8 mg/dL (ref >39.00)
LDL Cholesterol: 90 mg/dL (ref 0–99)
NonHDL: 108.55
Total CHOL/HDL Ratio: 3
Triglycerides: 91 mg/dL (ref 0.0–149.0)
VLDL: 18.2 mg/dL (ref 0.0–40.0)

## 2019-09-24 LAB — VITAMIN D 25 HYDROXY (VIT D DEFICIENCY, FRACTURES): VITD: 71.13 ng/mL (ref 30.00–100.00)

## 2019-09-24 NOTE — Patient Instructions (Signed)

## 2019-09-24 NOTE — Progress Notes (Signed)
HPI:  Pt presents to the clinic today for her subsequent annual Medicare Wellness Exam.  Past Medical History:  Diagnosis Date  . Arthritis   . Chicken pox     Current Outpatient Medications  Medication Sig Dispense Refill  . betamethasone valerate ointment (VALISONE) 0.1 % Apply 1 application topically 2 (two) times daily. 30 g 0  . Calcium-Magnesium-Vitamin D (CALCIUM 500 PO) Take by mouth.    . diphenhydrAMINE (BENADRYL) 25 mg capsule Take by mouth.    . fluticasone (FLONASE) 50 MCG/ACT nasal spray Place 2 sprays into both nostrils daily. 16 g 6  . Magnesium 400 MG CAPS Take by mouth.     No current facility-administered medications for this visit.    No Known Allergies  Family History  Problem Relation Age of Onset  . Cancer Mother        Breast  . Breast cancer Mother 60  . Alcohol abuse Father   . Cancer Father        Lung and Prostate  . Arthritis Son     Social History   Socioeconomic History  . Marital status: Married    Spouse name: Not on file  . Number of children: Not on file  . Years of education: Not on file  . Highest education level: Not on file  Occupational History  . Not on file  Tobacco Use  . Smoking status: Never Smoker  . Smokeless tobacco: Never Used  Substance and Sexual Activity  . Alcohol use: Yes    Alcohol/week: 0.0 standard drinks    Comment: occasional  . Drug use: No  . Sexual activity: Not on file  Other Topics Concern  . Not on file  Social History Narrative  . Not on file   Social Determinants of Health   Financial Resource Strain:   . Difficulty of Paying Living Expenses: Not on file  Food Insecurity:   . Worried About Charity fundraiser in the Last Year: Not on file  . Ran Out of Food in the Last Year: Not on file  Transportation Needs:   . Lack of Transportation (Medical): Not on file  . Lack of Transportation (Non-Medical): Not on file  Physical Activity:   . Days of Exercise per Week: Not on file  .  Minutes of Exercise per Session: Not on file  Stress:   . Feeling of Stress : Not on file  Social Connections:   . Frequency of Communication with Friends and Family: Not on file  . Frequency of Social Gatherings with Friends and Family: Not on file  . Attends Religious Services: Not on file  . Active Member of Clubs or Organizations: Not on file  . Attends Archivist Meetings: Not on file  . Marital Status: Not on file  Intimate Partner Violence:   . Fear of Current or Ex-Partner: Not on file  . Emotionally Abused: Not on file  . Physically Abused: Not on file  . Sexually Abused: Not on file    Hospitiliaztions: None  Health Maintenance:    Flu: 05/2019  Tetanus: 08/2014  Pneumovax: 08/2018  Prevnar: 08/2017  Zostavax: 08/2014  Shingrix: never  COVID: never  Mammogram: 03/2019  Pap Smear: 08/2016  Bone Density: 10/2017  Colon Screening: 09/2014  Eye Doctor: annually   Dental Exam: annually    Providers:   PCP: Webb Silversmith, NP  Eye doctor: Dr. Lindalou Hose      I have personally reviewed and have noted:  1.  The patient's medical and social history 2. Their use of alcohol, tobacco or illicit drugs 3. Their current medications and supplements 4. The patient's functional ability including ADL's, fall risks, home safety risks and  hearing or visual impairment. 5. Diet and physical activities 6. Evidence for depression or mood disorder  Subjective:   Review of Systems:   Constitutional: Denies fever, malaise, fatigue, headache or abrupt weight changes.  HEENT: Denies eye pain, eye redness, ear pain, ringing in the ears, wax buildup, runny nose, nasal congestion, bloody nose, or sore throat. Respiratory: Denies difficulty breathing, shortness of breath, cough or sputum production.   Cardiovascular: Denies chest pain, chest tightness, palpitations or swelling in the hands or feet.  Gastrointestinal: Denies abdominal pain, bloating, constipation, diarrhea or blood in  the stool.  GU: Denies urgency, frequency, pain with urination, burning sensation, blood in urine, odor or discharge. Musculoskeletal: Denies decrease in range of motion, difficulty with gait, muscle pain or joint pain and swelling.  Skin: Denies redness, rashes, lesions or ulcercations.  Neurological: Pt reports forgetfulness. Denies dizziness, difficulty with speech or problems with balance and coordination.  Psych: Denies anxiety, depression, SI/HI.  No other specific complaints in a complete review of systems (except as listed in HPI above).  Objective:  PE:   BP 124/76   Pulse 74   Temp (!) 97.4 F (36.3 C) (Temporal)   Ht 5' 3.25" (1.607 m)   Wt 133 lb (60.3 kg)   SpO2 97%   BMI 23.37 kg/m   Wt Readings from Last 3 Encounters:  09/17/18 137 lb (62.1 kg)  09/11/17 135 lb (61.2 kg)  12/19/16 136 lb (61.7 kg)    General: Appears her stated age, well developed, well nourished in NAD. Skin: Warm, dry and intact. No rashesnoted. HEENT: Head: normal shape and size; Eyes: sclera white, no icterus, conjunctiva pink, PERRLA and EOMs intact;  Neck: Neck supple, trachea midline. No masses, lumps or thyromegaly present.  Cardiovascular: Normal rate and rhythm. S1,S2 noted.  No murmur, rubs or gallops noted. No JVD or BLE edema. No carotid bruits noted. Pulmonary/Chest: Normal effort and positive vesicular breath sounds. No respiratory distress. No wheezes, rales or ronchi noted.  Abdomen: Soft and nontender. Normal bowel sounds. No distention or masses noted. Liver, spleen and kidneys non palpable. Musculoskeletal:  Strength 5/5 BUE/BLE. No difficulty with gait. Neurological: Alert and oriented. Cranial nerves II-XII grossly intact. Coordination normal.  Psychiatric: Mood and affect normal. Behavior is normal. Judgment and thought content normal.     BMET    Component Value Date/Time   NA 138 09/17/2018 0844   K 4.7 09/17/2018 0844   CL 100 09/17/2018 0844   CO2 30 09/17/2018  0844   GLUCOSE 94 09/17/2018 0844   BUN 12 09/17/2018 0844   CREATININE 0.79 09/17/2018 0844   CREATININE 0.70 09/12/2016 0759   CALCIUM 9.6 09/17/2018 0844    Lipid Panel     Component Value Date/Time   CHOL 159 09/17/2018 0844   TRIG 76.0 09/17/2018 0844   HDL 59.70 09/17/2018 0844   CHOLHDL 3 09/17/2018 0844   VLDL 15.2 09/17/2018 0844   LDLCALC 84 09/17/2018 0844    CBC    Component Value Date/Time   WBC 6.9 09/11/2017 1523   RBC 4.11 09/11/2017 1523   HGB 13.0 09/11/2017 1523   HCT 40.2 09/11/2017 1523   PLT 230.0 09/11/2017 1523   MCV 97.8 09/11/2017 1523   MCH 30.8 09/12/2016 0759   MCHC 32.2 09/11/2017  1523   RDW 12.5 09/11/2017 1523    Hgb A1C Lab Results  Component Value Date   HGBA1C 5.7 (H) 09/12/2016      Assessment and Plan:   Medicare Annual Wellness Visit:  Diet: She eats meat, fruit and veggies. She likes fried foods. She drinks mostly sweet tea with some water.  Physical activity: She walks 5 miles per day. She does weights in the morning.  Depression/mood screen: Negative, PHQ 9 score of 0 Hearing: Intact to whispered voice Visual acuity: Grossly normal, performs annual eye exam  ADLs: Capable Fall risk: None Home safety: Good Cognitive evaluation: Has trouble with recall. Intact to orientation, naming, and repetition. MMSE score 29. EOL planning: She has a living will, full code/ I agree  Preventative Medicine: Flu shot UTD. Tetanus, pneumovax, prevnar and zostovax UTD. She declines Shingrix at this time. She will consider Covid vaccine once available. Mammogram, pap smear and bone density UTD. Colon screening UTD. Encouraged her to consume a balanced diet and exercise regimen. Advised her to see an eye doctor and dentist annually. Will check CMET, Lipid and Vit D today. Due dates for screening exam given to patient as part of her AVS.   Next appointment: 1 year, Medicare Wellness Exam   Webb Silversmith, NP This visit occurred during the  SARS-CoV-2 public health emergency.  Safety protocols were in place, including screening questions prior to the visit, additional usage of staff PPE, and extensive cleaning of exam room while observing appropriate contact time as indicated for disinfecting solutions.

## 2020-01-16 DIAGNOSIS — J029 Acute pharyngitis, unspecified: Secondary | ICD-10-CM | POA: Diagnosis not present

## 2020-01-16 DIAGNOSIS — J40 Bronchitis, not specified as acute or chronic: Secondary | ICD-10-CM | POA: Diagnosis not present

## 2020-01-16 DIAGNOSIS — J069 Acute upper respiratory infection, unspecified: Secondary | ICD-10-CM | POA: Diagnosis not present

## 2020-01-20 DIAGNOSIS — R05 Cough: Secondary | ICD-10-CM | POA: Diagnosis not present

## 2020-01-20 DIAGNOSIS — R432 Parageusia: Secondary | ICD-10-CM | POA: Diagnosis not present

## 2020-01-20 DIAGNOSIS — R0602 Shortness of breath: Secondary | ICD-10-CM | POA: Diagnosis not present

## 2020-01-20 DIAGNOSIS — J3489 Other specified disorders of nose and nasal sinuses: Secondary | ICD-10-CM | POA: Diagnosis not present

## 2020-01-20 DIAGNOSIS — R430 Anosmia: Secondary | ICD-10-CM | POA: Diagnosis not present

## 2020-01-20 DIAGNOSIS — J189 Pneumonia, unspecified organism: Secondary | ICD-10-CM | POA: Diagnosis not present

## 2020-01-30 ENCOUNTER — Other Ambulatory Visit: Payer: Self-pay

## 2020-01-30 ENCOUNTER — Ambulatory Visit (INDEPENDENT_AMBULATORY_CARE_PROVIDER_SITE_OTHER): Payer: Medicare Other | Admitting: Internal Medicine

## 2020-01-30 ENCOUNTER — Ambulatory Visit (INDEPENDENT_AMBULATORY_CARE_PROVIDER_SITE_OTHER)
Admission: RE | Admit: 2020-01-30 | Discharge: 2020-01-30 | Disposition: A | Discharge status: Home / Self Care | Payer: Medicare Other | Source: Ambulatory Visit | Attending: Internal Medicine | Admitting: Internal Medicine

## 2020-01-30 ENCOUNTER — Encounter: Payer: Self-pay | Admitting: Internal Medicine

## 2020-01-30 VITALS — BP 122/80 | HR 77 | Temp 98.5°F | Wt 134.0 lb

## 2020-01-30 DIAGNOSIS — R062 Wheezing: Secondary | ICD-10-CM | POA: Diagnosis not present

## 2020-01-30 DIAGNOSIS — J984 Other disorders of lung: Secondary | ICD-10-CM | POA: Diagnosis not present

## 2020-01-30 DIAGNOSIS — J189 Pneumonia, unspecified organism: Secondary | ICD-10-CM | POA: Diagnosis not present

## 2020-01-30 DIAGNOSIS — R519 Headache, unspecified: Secondary | ICD-10-CM | POA: Diagnosis not present

## 2020-01-30 DIAGNOSIS — R0981 Nasal congestion: Secondary | ICD-10-CM

## 2020-01-30 MED ORDER — METHYLPREDNISOLONE ACETATE 80 MG/ML IJ SUSP
80.0000 mg | Freq: Once | INTRAMUSCULAR | Status: AC
  Administered 2020-01-30: 80 mg via INTRAMUSCULAR

## 2020-01-30 NOTE — Patient Instructions (Signed)
Community-Acquired Pneumonia, Adult Pneumonia is an infection of the lungs. It causes swelling in the airways of the lungs. Mucus and fluid may also build up inside the airways. One type of pneumonia can happen while a person is in a hospital. A different type can happen when a person is not in a hospital (community-acquired pneumonia).  What are the causes?  This condition is caused by germs (viruses, bacteria, or fungi). Some types of germs can be passed from one person to another. This can happen when you breathe in droplets from the cough or sneeze of an infected person. What increases the risk? You are more likely to develop this condition if you:  Have a long-term (chronic) disease, such as: ? Chronic obstructive pulmonary disease (COPD). ? Asthma. ? Cystic fibrosis. ? Congestive heart failure. ? Diabetes. ? Kidney disease.  Have HIV.  Have sickle cell disease.  Have had your spleen removed.  Do not take good care of your teeth and mouth (poor dental hygiene).  Have a medical condition that increases the risk of breathing in droplets from your own mouth and nose.  Have a weakened body defense system (immune system).  Are a smoker.  Travel to areas where the germs that cause this illness are common.  Are around certain animals or the places they live. What are the signs or symptoms?  A dry cough.  A wet (productive) cough.  Fever.  Sweating.  Chest pain. This often happens when breathing deeply or coughing.  Fast breathing or trouble breathing.  Shortness of breath.  Shaking chills.  Feeling tired (fatigue).  Muscle aches. How is this treated? Treatment for this condition depends on many things. Most adults can be treated at home. In some cases, treatment must happen in a hospital. Treatment may include:  Medicines given by mouth or through an IV tube.  Being given extra oxygen.  Respiratory therapy. In rare cases, treatment for very bad pneumonia  may include:  Using a machine to help you breathe.  Having a procedure to remove fluid from around your lungs. Follow these instructions at home: Medicines  Take over-the-counter and prescription medicines only as told by your doctor. ? Only take cough medicine if you are losing sleep.  If you were prescribed an antibiotic medicine, take it as told by your doctor. Do not stop taking the antibiotic even if you start to feel better. General instructions   Sleep with your head and neck raised (elevated). You can do this by sleeping in a recliner or by putting a few pillows under your head.  Rest as needed. Get at least 8 hours of sleep each night.  Drink enough water to keep your pee (urine) pale yellow.  Eat a healthy diet that includes plenty of vegetables, fruits, whole grains, low-fat dairy products, and lean protein.  Do not use any products that contain nicotine or tobacco. These include cigarettes, e-cigarettes, and chewing tobacco. If you need help quitting, ask your doctor.  Keep all follow-up visits as told by your doctor. This is important. How is this prevented? A shot (vaccine) can help prevent pneumonia. Shots are often suggested for:  People older than 68 years of age.  People older than 68 years of age who: ? Are having cancer treatment. ? Have long-term (chronic) lung disease. ? Have problems with their body's defense system. You may also prevent pneumonia if you take these actions:  Get the flu (influenza) shot every year.  Go to the dentist as   often as told.  Wash your hands often. If you cannot use soap and water, use hand sanitizer. Contact a doctor if:  You have a fever.  You lose sleep because your cough medicine does not help. Get help right away if:  You are short of breath and it gets worse.  You have more chest pain.  Your sickness gets worse. This is very serious if: ? You are an older adult. ? Your body's defense system is weak.  You  cough up blood. Summary  Pneumonia is an infection of the lungs.  Most adults can be treated at home. Some will need treatment in a hospital.  Drink enough water to keep your pee pale yellow.  Get at least 8 hours of sleep each night. This information is not intended to replace advice given to you by your health care provider. Make sure you discuss any questions you have with your health care provider. Document Revised: 12/05/2018 Document Reviewed: 04/12/2018 Elsevier Patient Education  2020 [employer]  

## 2020-01-30 NOTE — Progress Notes (Signed)
Subjective:    Patient ID: Caitlyn Martinez, female    DOB: 03/06/1951, 69 y.o.   MRN: [id number]  HPI  Patient presents to the clinic today for follow-up.  She presented UC 5/20 with complaint of loss of taste and smell, runny nose, nasal congestion, cough and shortness of breath. She was diagnosed with URI, treated with Azithromax. She did not have any improvement, went back to [facility] 5/24.  Chest x-ray was concerning for pneumonia.  Her Covid test was negative.  She was treated with Zithromax and Tessalon Perles.  Since that time, she reports headache, nasal congestion and cough. The headache is located all over her head. She describes the pain as "fluffy". She denies dizziness or visual changes. She is not blowing anything out of her nose. The cough is mostly non productive. She denies denies runny nose, ear pain, sore throat, or SOB. She denies fever, chills or body aches. She is feeling some better.  Review of Systems  Past Medical History:  Diagnosis Date  . Arthritis   . Chicken pox     Current Outpatient Medications  Medication Sig Dispense Refill  . betamethasone valerate ointment (VALISONE) 0.1 % Apply 1 application topically 2 (two) times daily. 30 g 0  . Calcium Carbonate (CALCIUM 600 PO) Take by mouth.    . Calcium-Magnesium-Vitamin D (CALCIUM 500 PO) Take by mouth.    . Cholecalciferol 25 MCG (1000 UT) capsule Take 2,000 Units by mouth daily.    . diphenhydrAMINE (BENADRYL) 25 mg capsule Take by mouth.    . fluticasone (FLONASE) 50 MCG/ACT nasal spray Place 2 sprays into both nostrils daily. 16 g 6  . Magnesium 400 MG CAPS Take by mouth.    . vitamin B-12 (CYANOCOBALAMIN) 1000 MCG tablet Take 1,000 mcg by mouth daily.     No current facility-administered medications for this visit.    No Known Allergies  Family History  Problem Relation Age of Onset  . Cancer Mother        Breast  . Breast cancer Mother 43  . Alcohol abuse Father   . Cancer Father        Lung and  Prostate  . Arthritis Son     Social History   Socioeconomic History  . Marital status: Married    Spouse name: Not on file  . Number of children: Not on file  . Years of education: Not on file  . Highest education level: Not on file  Occupational History  . Not on file  Tobacco Use  . Smoking status: Never Smoker  . Smokeless tobacco: Never Used  Substance and Sexual Activity  . Alcohol use: Yes    Alcohol/week: 0.0 standard drinks    Comment: occasional  . Drug use: No  . Sexual activity: Not on file  Other Topics Concern  . Not on file  Social History Narrative  . Not on file   Social Determinants of Health   Financial Resource Strain:   . Difficulty of Paying Living Expenses:   Food Insecurity:   . Worried About Charity fundraiser in the Last Year:   . Arboriculturist in the Last Year:   Transportation Needs:   . Film/video editor (Medical):   Marland Kitchen Lack of Transportation (Non-Medical):   Physical Activity:   . Days of Exercise per Week:   . Minutes of Exercise per Session:   Stress:   . Feeling of Stress :   Social Connections:   .  Frequency of Communication with Friends and Family:   . Frequency of Social Gatherings with Friends and Family:   . Attends Religious Services:   . Active Member of Clubs or Organizations:   . Attends Archivist Meetings:   Marland Kitchen Marital Status:   Intimate Partner Violence:   . Fear of Current or Ex-Partner:   . Emotionally Abused:   Marland Kitchen Physically Abused:   . Sexually Abused:      Constitutional: Pt reports headache. Denies fever, malaise, fatigue, or abrupt weight changes.  HEENT: Pt reports nasal congestion. Denies eye pain, eye redness, ear pain, ringing in the ears, wax buildup, runny nose, bloody nose, or sore throat. Respiratory: Pt reports cough. Denies difficulty breathing, shortness of breath, or sputum production.   Cardiovascular: Denies chest pain, chest tightness, palpitations or swelling in the hands or  feet.   No other specific complaints in a complete review of systems (except as listed in HPI above).     Objective:   Physical Exam  BP 122/80   Pulse 77   Temp 98.5 F (36.9 C) (Temporal)   Wt 134 lb (60.8 kg)   SpO2 98%   BMI 23.55 kg/m   Wt Readings from Last 3 Encounters:  09/24/19 133 lb (60.3 kg)  09/17/18 137 lb (62.1 kg)  09/11/17 135 lb (61.2 kg)    General: Appears her stated age, well developed, well nourished in NAD. HEENT: Head: normal shape and size; Eyes: sclera white, no icterus, conjunctiva pink, PERRLA and EOMs intact;  Neck:  Neck supple, trachea midline. No masses, lumps or thyromegaly present.  Cardiovascular: Normal rate and rhythm. S1,S2 noted.  No murmur, rubs or gallops noted.  Pulmonary/Chest: Normal effort and positive vesicular breath sounds with intermittent expiratory wheeze noted. No respiratory distress. No rales or ronchi noted.  Neurological: Alert and oriented.   BMET    Component Value Date/Time   NA 136 09/24/2019 0848   K 4.2 09/24/2019 0848   CL 100 09/24/2019 0848   CO2 30 09/24/2019 0848   GLUCOSE 88 09/24/2019 0848   BUN 13 09/24/2019 0848   CREATININE 0.78 09/24/2019 0848   CREATININE 0.70 09/12/2016 0759   CALCIUM 9.4 09/24/2019 0848    Lipid Panel     Component Value Date/Time   CHOL 172 09/24/2019 0848   TRIG 91.0 09/24/2019 0848   HDL 63.80 09/24/2019 0848   CHOLHDL 3 09/24/2019 0848   VLDL 18.2 09/24/2019 0848   LDLCALC 90 09/24/2019 0848    CBC    Component Value Date/Time   WBC 6.9 09/11/2017 1523   RBC 4.11 09/11/2017 1523   HGB 13.0 09/11/2017 1523   HCT 40.2 09/11/2017 1523   PLT 230.0 09/11/2017 1523   MCV 97.8 09/11/2017 1523   MCH 30.8 09/12/2016 0759   MCHC 32.2 09/11/2017 1523   RDW 12.5 09/11/2017 1523    Hgb A1C Lab Results  Component Value Date   HGBA1C 5.7 (H) 09/12/2016           Assessment & Plan:   UC follow-up for Pneumonia, Headache, Nasal Congestion, Cough:  UC  notes, labs reviewed 80 mg Depo IM today Repeat chest xray today Will hold off on additional abx pending xray  RTC as needed or if symptoms persist or worsen Webb Silversmith, NP This visit occurred during the SARS-CoV-2 public health emergency.  Safety protocols were in place, including screening questions prior to the visit, additional usage of staff PPE, and extensive cleaning of exam  room while observing appropriate contact time as indicated for disinfecting solutions.

## 2020-01-31 ENCOUNTER — Telehealth: Payer: Self-pay

## 2020-01-31 NOTE — Telephone Encounter (Signed)
Pt called asking for results of Chest Xray. I advised her they were sent to Mychart at 1:35 today. I gave her the results.

## 2020-05-05 ENCOUNTER — Telehealth: Payer: Self-pay | Admitting: *Deleted

## 2020-05-05 MED ORDER — CLOBETASOL PROPIONATE 0.05 % EX OINT: 1.0000 "application " | TOPICAL_OINTMENT | Freq: Two times a day (BID) | CUTANEOUS | 0 refills | Status: DC

## 2020-05-05 NOTE — Telephone Encounter (Signed)
Patient called requesting a script for Clobetasol ointment 0.05%. Patient stated that she has not had this for a while, but is having a flare-up with Eczema on her hands. Patient stated that Webb Silversmith NP is aware of this problem and has given her this medication previously. Patient was advised that Webb Silversmith NP is out of the office this week. Patient requested that a message go back to one of the other providers to see if they will send it in for her. Pharmacy [facility]

## 2020-05-05 NOTE — Telephone Encounter (Signed)
Please let her know that I did send in a prescription for the cream. She should use it very sparingly--since it is very potent

## 2020-05-12 NOTE — Telephone Encounter (Signed)
Please, sign and close encounter when completed.

## 2020-06-03 DIAGNOSIS — Z23 Encounter for immunization: Secondary | ICD-10-CM | POA: Diagnosis not present

## 2020-06-10 ENCOUNTER — Other Ambulatory Visit: Payer: Self-pay | Admitting: Internal Medicine

## 2020-06-10 DIAGNOSIS — Z1231 Encounter for screening mammogram for malignant neoplasm of breast: Secondary | ICD-10-CM

## 2020-06-23 DIAGNOSIS — Z23 Encounter for immunization: Secondary | ICD-10-CM | POA: Diagnosis not present

## 2020-09-22 DIAGNOSIS — D485 Neoplasm of uncertain behavior of skin: Secondary | ICD-10-CM | POA: Diagnosis not present

## 2020-09-22 DIAGNOSIS — D2261 Melanocytic nevi of right upper limb, including shoulder: Secondary | ICD-10-CM | POA: Diagnosis not present

## 2020-09-22 DIAGNOSIS — L570 Actinic keratosis: Secondary | ICD-10-CM | POA: Diagnosis not present

## 2020-09-22 DIAGNOSIS — D2262 Melanocytic nevi of left upper limb, including shoulder: Secondary | ICD-10-CM | POA: Diagnosis not present

## 2020-09-22 DIAGNOSIS — H2513 Age-related nuclear cataract, bilateral: Secondary | ICD-10-CM | POA: Diagnosis not present

## 2020-09-22 DIAGNOSIS — L309 Dermatitis, unspecified: Secondary | ICD-10-CM | POA: Diagnosis not present

## 2020-09-23 ENCOUNTER — Ambulatory Visit
Admission: RE | Admit: 2020-09-23 | Discharge: 2020-09-23 | Disposition: A | Discharge status: Home / Self Care | Payer: Medicare Other | Source: Ambulatory Visit | Attending: Internal Medicine | Admitting: Internal Medicine

## 2020-09-23 ENCOUNTER — Other Ambulatory Visit: Payer: Self-pay

## 2020-09-23 ENCOUNTER — Telehealth: Payer: Self-pay | Admitting: *Deleted

## 2020-09-23 DIAGNOSIS — D225 Melanocytic nevi of trunk: Secondary | ICD-10-CM | POA: Diagnosis not present

## 2020-09-23 DIAGNOSIS — Z1231 Encounter for screening mammogram for malignant neoplasm of breast: Secondary | ICD-10-CM | POA: Insufficient documentation

## 2020-09-23 DIAGNOSIS — C44622 Squamous cell carcinoma of skin of right upper limb, including shoulder: Secondary | ICD-10-CM | POA: Diagnosis not present

## 2020-09-23 NOTE — Telephone Encounter (Signed)
Patient called stating that she has a physical scheduled next week and wants to know if she should come in to have lab work prior to her physical. Patient stated that her husband's doctor has him come in prior to his CPE to have lab work. Advised patient that Webb Silversmith NP orders the lab work while she is here having her physical. Patient stated that there is some lab work that she may want to have done. Patient was advised that she can discuss that at her appoinmtnent.

## 2020-09-28 ENCOUNTER — Encounter: Payer: Self-pay | Admitting: Internal Medicine

## 2020-09-28 ENCOUNTER — Other Ambulatory Visit: Payer: Self-pay

## 2020-09-28 ENCOUNTER — Ambulatory Visit (INDEPENDENT_AMBULATORY_CARE_PROVIDER_SITE_OTHER): Payer: Medicare Other | Admitting: Internal Medicine

## 2020-09-28 VITALS — BP 120/76 | HR 76 | Temp 97.4°F | Ht 63.0 in | Wt 133.0 lb

## 2020-09-28 DIAGNOSIS — Z0000 Encounter for general adult medical examination without abnormal findings: Secondary | ICD-10-CM

## 2020-09-28 DIAGNOSIS — K5900 Constipation, unspecified: Secondary | ICD-10-CM | POA: Diagnosis not present

## 2020-09-28 DIAGNOSIS — Z1152 Encounter for screening for COVID-19: Secondary | ICD-10-CM | POA: Diagnosis not present

## 2020-09-28 DIAGNOSIS — M810 Age-related osteoporosis without current pathological fracture: Secondary | ICD-10-CM

## 2020-09-28 LAB — CBC
HCT: 37.9 % (ref 36.0–46.0)
Hemoglobin: 12.8 g/dL (ref 12.0–15.0)
MCHC: 33.9 g/dL (ref 30.0–36.0)
MCV: 93.8 fl (ref 78.0–100.0)
Platelets: 205 10*3/uL (ref 150.0–400.0)
RBC: 4.04 Mil/uL (ref 3.87–5.11)
RDW: 12.8 % (ref 11.5–15.5)
WBC: 4.2 10*3/uL (ref 4.0–10.5)

## 2020-09-28 LAB — COMPREHENSIVE METABOLIC PANEL
ALT: 15 U/L (ref 0–35)
AST: 18 U/L (ref 0–37)
Albumin: 4.4 g/dL (ref 3.5–5.2)
Alkaline Phosphatase: 82 U/L (ref 39–117)
BUN: 11 mg/dL (ref 6–23)
CO2: 31 mEq/L (ref 19–32)
Calcium: 9.7 mg/dL (ref 8.4–10.5)
Chloride: 98 mEq/L (ref 96–112)
Creatinine, Ser: 0.63 mg/dL (ref 0.40–1.20)
GFR: 91.06 mL/min (ref >60.00)
Glucose, Bld: 100 mg/dL — ABNORMAL HIGH (ref 70–99)
Potassium: 4.9 mEq/L (ref 3.5–5.1)
Sodium: 134 mEq/L — ABNORMAL LOW (ref 135–145)
Total Bilirubin: 0.5 mg/dL (ref 0.2–1.2)
Total Protein: 7.4 g/dL (ref 6.0–8.3)

## 2020-09-28 LAB — LIPID PANEL
Cholesterol: 160 mg/dL (ref 0–200)
HDL: 58.1 mg/dL (ref >39.00)
LDL Cholesterol: 80 mg/dL (ref 0–99)
NonHDL: 102.37
Total CHOL/HDL Ratio: 3
Triglycerides: 110 mg/dL (ref 0.0–149.0)
VLDL: 22 mg/dL (ref 0.0–40.0)

## 2020-09-28 LAB — VITAMIN D 25 HYDROXY (VIT D DEFICIENCY, FRACTURES): VITD: 65.86 ng/mL (ref 30.00–100.00)

## 2020-09-28 NOTE — Patient Instructions (Signed)

## 2020-09-28 NOTE — Progress Notes (Signed)
HPI:  Pt presents to the clinic today for her subsequent annual Medicare Wellness Exam.  Osteoporosis: She is taking Calcium and Vit D as prescribed. She tries to get weight bearing exercise daily.  Past Medical History:  Diagnosis Date  . Arthritis   . Chicken pox     Current Outpatient Medications  Medication Sig Dispense Refill  . Calcium Carbonate (CALCIUM 600 PO) Take by mouth.    . Cholecalciferol (VITAMIN D3) 25 MCG (1000 UT) CAPS Take 2,000 Units by mouth.    . clobetasol ointment (TEMOVATE) 0.05 % Apply 1 application topically 2 (two) times daily. 15 g 0  . vitamin B-12 (CYANOCOBALAMIN) 1000 MCG tablet Take 1,000 mcg by mouth daily.     No current facility-administered medications for this visit.    No Known Allergies  Family History  Problem Relation Age of Onset  . Cancer Mother        Breast  . Breast cancer Mother [id number]  . Alcohol abuse Father   . Cancer Father        Lung and Prostate  . Arthritis Son     Social History   Socioeconomic History  . Marital status: Married    Spouse name: Not on file  . Number of children: Not on file  . Years of education: Not on file  . Highest education level: Not on file  Occupational History  . Not on file  Tobacco Use  . Smoking status: Never Smoker  . Smokeless tobacco: Never Used  Substance and Sexual Activity  . Alcohol use: Yes    Alcohol/week: 0.0 standard drinks    Comment: occasional  . Drug use: No  . Sexual activity: Not on file  Other Topics Concern  . Not on file  Social History Narrative  . Not on file   Social Determinants of Health   Financial Resource Strain: Not on file  Food Insecurity: Not on file  Transportation Needs: Not on file  Physical Activity: Not on file  Stress: Not on file  Social Connections: Not on file  Intimate Partner Violence: Not on file    Hospitiliaztions: None  Health Maintenance:    Flu: 05/2020  Tetanus: 08/2014  Pneumovax: 08/2018  Prevnar:  08/2017  Zostavax: 08/2014  Shingrix: never  Covid: Moderna x 3  Mammogram: 08/2020  Pap Smear: 08/2016  Bone Density: 10/2017  Colon Screening: 09/2014  Eye Doctor: annually  Dental Exam: biannually   Providers:   PCP: Nicki Reaperegina Baity, NP  I have personally reviewed and have noted:  1. The patient's medical and social history 2. Their use of alcohol, tobacco or illicit drugs 3. Their current medications and supplements 4. The patient's functional ability including ADL's, fall risks, home safety risks and hearing or visual impairment. 5. Diet and physical activities 6. Evidence for depression or mood disorder  Subjective:   Review of Systems:   Constitutional: Denies fever, malaise, fatigue, headache or abrupt weight changes.  HEENT: Denies eye pain, eye redness, ear pain, ringing in the ears, wax buildup, runny nose, nasal congestion, bloody nose, or sore throat. Respiratory: Denies difficulty breathing, shortness of breath, cough or sputum production.   Cardiovascular: Denies chest pain, chest tightness, palpitations or swelling in the hands or feet.  Gastrointestinal: Pt reports constipation. Denies abdominal pain, bloating, diarrhea or blood in the stool.  GU: Denies urgency, frequency, pain with urination, burning sensation, blood in urine, odor or discharge. Musculoskeletal: Denies decrease in range of motion, difficulty with gait, muscle  pain or joint pain and swelling.  Skin: Denies redness, rashes, lesions or ulcercations.  Neurological: Denies dizziness, difficulty with memory, difficulty with speech or problems with balance and coordination.  Psych: Denies anxiety, depression, SI/HI.  No other specific complaints in a complete review of systems (except as listed in HPI above).  Objective:  PE:   BP 120/76   Pulse 76   Temp (!) 97.4 F (36.3 C) (Temporal)   Ht 5\' 3"  (1.6 m)   Wt 133 lb (60.3 kg)   SpO2 99%   BMI 23.56 kg/m   Wt Readings from Last 3 Encounters:   01/30/20 134 lb (60.8 kg)  09/24/19 133 lb (60.3 kg)  09/17/18 137 lb (62.1 kg)    General: Appears her stated age, well developed, well nourished in NAD. Skin: Warm, dry and intact. No rashes noted. HEENT: Head: normal shape and size; Eyes: sclera white, no icterus, conjunctiva pink, PERRLA and EOMs intact;  Neck: Neck supple, trachea midline. No masses, lumps or thyromegaly present.  Cardiovascular: Normal rate and rhythm. S1,S2 noted.  No murmur, rubs or gallops noted. No JVD or BLE edema. No carotid bruits noted. Pulmonary/Chest: Normal effort and positive vesicular breath sounds. No respiratory distress. No wheezes, rales or ronchi noted.  Abdomen: Soft and nontender. Normal bowel sounds. No distention or masses noted. Liver, spleen and kidneys non palpable. Musculoskeletal:  Strength 5/5 BUE/BLE. No difficulty with gait. Neurological: Alert and oriented. Cranial nerves II-XII grossly intact. Coordination normal.  Psychiatric: Mood and affect normal. Behavior is normal. Judgment and thought content normal.     BMET    Component Value Date/Time   NA 136 09/24/2019 0848   K 4.2 09/24/2019 0848   CL 100 09/24/2019 0848   CO2 30 09/24/2019 0848   GLUCOSE 88 09/24/2019 0848   BUN 13 09/24/2019 0848   CREATININE 0.78 09/24/2019 0848   CREATININE 0.70 09/12/2016 0759   CALCIUM 9.4 09/24/2019 0848    Lipid Panel     Component Value Date/Time   CHOL 172 09/24/2019 0848   TRIG 91.0 09/24/2019 0848   HDL 63.80 09/24/2019 0848   CHOLHDL 3 09/24/2019 0848   VLDL 18.2 09/24/2019 0848   LDLCALC 90 09/24/2019 0848    CBC    Component Value Date/Time   WBC 6.9 09/11/2017 1523   RBC 4.11 09/11/2017 1523   HGB 13.0 09/11/2017 1523   HCT 40.2 09/11/2017 1523   PLT 230.0 09/11/2017 1523   MCV 97.8 09/11/2017 1523   MCH 30.8 09/12/2016 0759   MCHC 32.2 09/11/2017 1523   RDW 12.5 09/11/2017 1523    Hgb A1C Lab Results  Component Value Date   HGBA1C 5.7 (H) 09/12/2016       Assessment and Plan:   Medicare Annual Wellness Visit:  Diet: She does eat meat. She consumes fruits and veggies. She does eat some fried foods. She drinks mostly sweet tea, cranberry juice. Physical activity: Walking, gym 3 x week Depression/mood screen: Negative, PHQ 9 score of 0 Hearing: Intact to whispered voice Visual acuity: Grossly normal, performs annual eye exam  ADLs: Capable Fall risk: None Home safety: Good Cognitive evaluation: Intact to orientation, naming, recall and repetition EOL planning: No adv directives, full code/ I agree  Preventative Medicine: Flu shot UTD. Tetanus, pneumovax, prevnar and zostovax UTD. Encouraged her to get her Shingrix vaccine and Covid booster. Mammogram UTD. Pap smear UTD. Bone density UTD. Colon screening UTD. Encouraged her to consume a balanced diet and exercise regimen.  Advised her to see an eye doctor and dentist annually. Will check CBC, CMET, lipid and Vit D today. Due dates for screening exams given to patient as part of her AVS.   Next appointment: 1 year, Medicare Wellness Exam  Constipation:  Start Colace 100 mg PO QHS for 10 days Encouraged high fiber diet and consume adequate amounts of water daily  Webb Silversmith, NP This visit occurred during the SARS-CoV-2 public health emergency.  Safety protocols were in place, including screening questions prior to the visit, additional usage of staff PPE, and extensive cleaning of exam room while observing appropriate contact time as indicated for disinfecting solutions.

## 2020-09-28 NOTE — Assessment & Plan Note (Signed)
Will check Vit D today Continue Calcium and Vit D OTC Encouraged daily weight bearing exercise

## 2020-09-29 LAB — SARS COV-2 SEROLOGY(COVID-19)AB(IGG,IGM),IMMUNOASSAY
SARS CoV-2 AB IgG: POSITIVE — AB
SARS CoV-2 IgM: NEGATIVE

## 2020-10-02 DIAGNOSIS — C44622 Squamous cell carcinoma of skin of right upper limb, including shoulder: Secondary | ICD-10-CM | POA: Diagnosis not present

## 2020-10-02 DIAGNOSIS — D2361 Other benign neoplasm of skin of right upper limb, including shoulder: Secondary | ICD-10-CM | POA: Diagnosis not present

## 2021-01-26 ENCOUNTER — Encounter: Payer: Medicare Other | Admitting: Adult Health

## 2021-03-15 DIAGNOSIS — Z23 Encounter for immunization: Secondary | ICD-10-CM | POA: Diagnosis not present

## 2021-04-01 DIAGNOSIS — U071 COVID-19: Secondary | ICD-10-CM | POA: Diagnosis not present

## 2021-04-08 ENCOUNTER — Encounter: Payer: Medicare Other | Admitting: Adult Health

## 2021-06-14 DIAGNOSIS — Z23 Encounter for immunization: Secondary | ICD-10-CM | POA: Diagnosis not present

## 2021-07-12 ENCOUNTER — Encounter: Payer: Medicare Other | Admitting: Adult Health

## 2021-07-13 DIAGNOSIS — Z23 Encounter for immunization: Secondary | ICD-10-CM | POA: Diagnosis not present

## 2021-07-26 ENCOUNTER — Encounter: Payer: Medicare Other | Admitting: Adult Health

## 2021-08-02 DIAGNOSIS — D485 Neoplasm of uncertain behavior of skin: Secondary | ICD-10-CM | POA: Diagnosis not present

## 2021-08-02 DIAGNOSIS — D2262 Melanocytic nevi of left upper limb, including shoulder: Secondary | ICD-10-CM | POA: Diagnosis not present

## 2021-08-02 DIAGNOSIS — D225 Melanocytic nevi of trunk: Secondary | ICD-10-CM | POA: Diagnosis not present

## 2021-08-02 DIAGNOSIS — L570 Actinic keratosis: Secondary | ICD-10-CM | POA: Diagnosis not present

## 2021-08-02 DIAGNOSIS — X32XXXA Exposure to sunlight, initial encounter: Secondary | ICD-10-CM | POA: Diagnosis not present

## 2021-08-02 DIAGNOSIS — Z85828 Personal history of other malignant neoplasm of skin: Secondary | ICD-10-CM | POA: Diagnosis not present

## 2021-08-02 DIAGNOSIS — D0439 Carcinoma in situ of skin of other parts of face: Secondary | ICD-10-CM | POA: Diagnosis not present

## 2021-08-02 DIAGNOSIS — L308 Other specified dermatitis: Secondary | ICD-10-CM | POA: Diagnosis not present

## 2021-08-20 DIAGNOSIS — U071 COVID-19: Secondary | ICD-10-CM | POA: Diagnosis not present

## 2021-09-06 ENCOUNTER — Other Ambulatory Visit: Payer: Self-pay

## 2021-09-06 ENCOUNTER — Ambulatory Visit (INDEPENDENT_AMBULATORY_CARE_PROVIDER_SITE_OTHER): Payer: Medicare Other | Admitting: Nurse Practitioner

## 2021-09-06 ENCOUNTER — Encounter: Payer: Self-pay | Admitting: Nurse Practitioner

## 2021-09-06 VITALS — BP 156/82 | HR 76 | Temp 98.2°F | Resp 12 | Ht 63.0 in | Wt 141.1 lb

## 2021-09-06 DIAGNOSIS — Z7689 Persons encountering health services in other specified circumstances: Secondary | ICD-10-CM | POA: Diagnosis not present

## 2021-09-06 DIAGNOSIS — R030 Elevated blood-pressure reading, without diagnosis of hypertension: Secondary | ICD-10-CM | POA: Diagnosis not present

## 2021-09-06 DIAGNOSIS — M810 Age-related osteoporosis without current pathological fracture: Secondary | ICD-10-CM | POA: Diagnosis not present

## 2021-09-06 DIAGNOSIS — Z1231 Encounter for screening mammogram for malignant neoplasm of breast: Secondary | ICD-10-CM | POA: Diagnosis not present

## 2021-09-06 LAB — LIPID PANEL
Cholesterol: 175 mg/dL (ref 0–200)
HDL: 58.4 mg/dL (ref >39.00)
LDL Cholesterol: 88 mg/dL (ref 0–99)
NonHDL: 117.02
Total CHOL/HDL Ratio: 3
Triglycerides: 144 mg/dL (ref 0.0–149.0)
VLDL: 28.8 mg/dL (ref 0.0–40.0)

## 2021-09-06 LAB — CBC WITH DIFFERENTIAL/PLATELET
Basophils Absolute: 0.1 10*3/uL (ref 0.0–0.1)
Basophils Relative: 1 % (ref 0.0–3.0)
Eosinophils Absolute: 0.1 10*3/uL (ref 0.0–0.7)
Eosinophils Relative: 1.7 % (ref 0.0–5.0)
HCT: 39.2 % (ref 36.0–46.0)
Hemoglobin: 12.9 g/dL (ref 12.0–15.0)
Lymphocytes Relative: 41.2 % (ref 12.0–46.0)
Lymphs Abs: 2.1 10*3/uL (ref 0.7–4.0)
MCHC: 32.8 g/dL (ref 30.0–36.0)
MCV: 94.6 fl (ref 78.0–100.0)
Monocytes Absolute: 0.4 10*3/uL (ref 0.1–1.0)
Monocytes Relative: 8.7 % (ref 3.0–12.0)
Neutro Abs: 2.4 10*3/uL (ref 1.4–7.7)
Neutrophils Relative %: 47.4 % (ref 43.0–77.0)
Platelets: 209 10*3/uL (ref 150.0–400.0)
RBC: 4.15 Mil/uL (ref 3.87–5.11)
RDW: 12.5 % (ref 11.5–15.5)
WBC: 5.1 10*3/uL (ref 4.0–10.5)

## 2021-09-06 LAB — COMPREHENSIVE METABOLIC PANEL
ALT: 13 U/L (ref 0–35)
AST: 18 U/L (ref 0–37)
Albumin: 4.2 g/dL (ref 3.5–5.2)
Alkaline Phosphatase: 81 U/L (ref 39–117)
BUN: 12 mg/dL (ref 6–23)
CO2: 29 mEq/L (ref 19–32)
Calcium: 9.2 mg/dL (ref 8.4–10.5)
Chloride: 99 mEq/L (ref 96–112)
Creatinine, Ser: 0.69 mg/dL (ref 0.40–1.20)
GFR: 88.49 mL/min (ref >60.00)
Glucose, Bld: 98 mg/dL (ref 70–99)
Potassium: 4.4 mEq/L (ref 3.5–5.1)
Sodium: 135 mEq/L (ref 135–145)
Total Bilirubin: 0.5 mg/dL (ref 0.2–1.2)
Total Protein: 7.1 g/dL (ref 6.0–8.3)

## 2021-09-06 LAB — VITAMIN D 25 HYDROXY (VIT D DEFICIENCY, FRACTURES): VITD: 55.16 ng/mL (ref 30.00–100.00)

## 2021-09-06 NOTE — Progress Notes (Signed)
Established Patient Office Visit  Subjective:  Patient ID: Caitlyn Martinez, female    DOB: 09-01-1950  Age: 71 y.o. MRN: [id number]  CC:  Chief Complaint  Patient presents with   Transfer of Care   Would like order for mammogram    HPI Caitlyn Martinez presents for [facility] for complete physical and follow up of chronic conditions.  Immunizations: -Tetanus: 09/25/2014 -Influenza: At pharmacy UTD -Covid-19: moderna x 2 and 3 booster -Shingles: At pharmacy, didi review info -Pneumonia:  UTD  -HPV: aged out  Diet: [place]. 3 meals daily. Lighter first and second meal heavier dinner. Coffee in the am and Sweet tea ( half sugar and half sweet and low) 2 full glasses. She will have some water Exercise:  Walk daily approx 2 miles daily at home  Eye exam: Completes annually. Wears Readers  Dental exam: Completes semi-annually. 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 Last one at age 65 that was normal with no HPV. Hx of one abnormal in her 43s Mammogram: Completed in [place] [facility] last one 09/23/2020 Dexa: Completed in 2018. Showed osteoporosis but patient declined medication at that time Colonoscopy: Completed in 2016. Patient states it has been more recent than that.   Lung Cancer Screening: NA  PHQ9 SCORE ONLY 09/06/2021 09/06/2021 09/28/2020  PHQ-9 Total Score 2 0 0     Past Medical History:  Diagnosis Date   Arthritis    Chicken pox     Past Surgical History:  Procedure Laterality Date   RHINOPLASTY     TONSILLECTOMY AND ADENOIDECTOMY     TUBAL LIGATION  1987    Family History  Problem Relation Age of Onset   Cancer Mother        Breast   Breast cancer Mother 46   Alcohol abuse Father    Cancer Father        Lung and Prostate   Arthritis Son     Social History   Socioeconomic History   Marital status: Married    Spouse name: Not on file   Number of children: Not on file   Years of education: Not on file   Highest education level: Not  on file  Occupational History   Not on file  Tobacco Use   Smoking status: Never   Smokeless tobacco: Never  Substance and Sexual Activity   Alcohol use: Yes    Alcohol/week: 0.0 standard drinks    Comment: occasional   Drug use: No   Sexual activity: Not on file  Other Topics Concern   Not on file  Social History Narrative   Not on file   Social Determinants of Health   Financial Resource Strain: Not on file  Food Insecurity: Not on file  Transportation Needs: Not on file  Physical Activity: Not on file  Stress: Not on file  Social Connections: Not on file  Intimate Partner Violence: Not on file    Outpatient Medications Prior to Visit  Medication Sig Dispense Refill   augmented betamethasone dipropionate (DIPROLENE-AF) 0.05 % cream As needed     diphenhydrAMINE (BENADRYL ALLERGY) 25 mg capsule Take 25 mg by mouth at bedtime.     Magnesium Citrate 200 MG TABS      Calcium Carbonate (CALCIUM 600 PO) Take by mouth.     Cholecalciferol (VITAMIN D3) 25 MCG (1000 UT) CAPS Take 2,000 Units by mouth. (Patient not taking: Reported on 09/28/2020)     vitamin  B-12 (CYANOCOBALAMIN) 1000 MCG tablet Take 1,000 mcg by mouth daily. (Patient not taking: Reported on 09/28/2020)     No facility-administered medications prior to visit.    No Known Allergies  ROS Review of Systems  Constitutional:  Negative for chills, fatigue and fever.  Respiratory:  Negative for cough and shortness of breath.   Cardiovascular:  Negative for chest pain, palpitations and leg swelling.  Gastrointestinal:  Positive for constipation. Negative for abdominal pain, blood in stool, diarrhea, nausea and vomiting.       Goes from the normal to pellets.  Genitourinary:  Negative for dysuria, frequency, hematuria, vaginal bleeding, vaginal discharge and vaginal pain.       Nocturia x1  Neurological:  Negative for dizziness, weakness, light-headedness, numbness and headaches.  Psychiatric/Behavioral:  Negative  for hallucinations and suicidal ideas.      Objective:    Physical Exam Vitals and nursing note reviewed.  Constitutional:      Appearance: Normal appearance.  HENT:     Right Ear: Tympanic membrane, ear canal and external ear normal.     Left Ear: Tympanic membrane, ear canal and external ear normal.     Mouth/Throat:     Mouth: Mucous membranes are moist.  Eyes:     Extraocular Movements: Extraocular movements intact.     Pupils: Pupils are equal, round, and reactive to light.  Neck:     Thyroid: No thyroid mass, thyromegaly or thyroid tenderness.  Cardiovascular:     Rate and Rhythm: Normal rate and regular rhythm.     Pulses: Normal pulses.     Heart sounds: Normal heart sounds.  Pulmonary:     Effort: Pulmonary effort is normal.     Breath sounds: Normal breath sounds.  Abdominal:     General: Bowel sounds are normal. There is no distension.     Palpations: There is no mass.     Tenderness: There is no abdominal tenderness.  Musculoskeletal:     Right lower leg: No edema.     Left lower leg: No edema.  Lymphadenopathy:     Cervical: No cervical adenopathy.  Skin:    General: Skin is warm.  Neurological:     General: No focal deficit present.     Mental Status: She is alert.     Deep Tendon Reflexes:     Reflex Scores:      Bicep reflexes are 2+ on the right side and 2+ on the left side.      Patellar reflexes are 2+ on the right side and 2+ on the left side.    Comments: Bilateral upper and lower extremity strength 5/5  Psychiatric:        Mood and Affect: Mood normal.        Behavior: Behavior normal.        Thought Content: Thought content normal.        Judgment: Judgment normal.    BP (!) 156/82    Pulse 76    Temp 98.2 F (36.8 C)    Resp 12    Ht 5\' 3"  (1.6 m)    Wt 141 lb 2 oz (64 kg)    SpO2 99%    BMI 25.00 kg/m  Wt Readings from Last 3 Encounters:  09/06/21 141 lb 2 oz (64 kg)  09/28/20 133 lb (60.3 kg)  01/30/20 134 lb (60.8 kg)     Health  Maintenance Due  Topic Date Due   Zoster Vaccines- Shingrix (1 of  2) Never done   COVID-19 Vaccine (4 - Booster for Moderna series) 08/24/2020   INFLUENZA VACCINE  03/29/2021    There are no preventive care reminders to display for this patient.  Lab Results  Component Value Date   TSH 1.305 09/11/2015   Lab Results  Component Value Date   WBC 4.2 09/28/2020   HGB 12.8 09/28/2020   HCT 37.9 09/28/2020   MCV 93.8 09/28/2020   PLT 205.0 09/28/2020   Lab Results  Component Value Date   NA 134 (L) 09/28/2020   K 4.9 09/28/2020   CO2 31 09/28/2020   GLUCOSE 100 (H) 09/28/2020   BUN 11 09/28/2020   CREATININE 0.63 09/28/2020   BILITOT 0.5 09/28/2020   ALKPHOS 82 09/28/2020   AST 18 09/28/2020   ALT 15 09/28/2020   PROT 7.4 09/28/2020   ALBUMIN 4.4 09/28/2020   CALCIUM 9.7 09/28/2020   GFR 91.06 09/28/2020   Lab Results  Component Value Date   CHOL 160 09/28/2020   Lab Results  Component Value Date   HDL 58.10 09/28/2020   Lab Results  Component Value Date   LDLCALC 80 09/28/2020   Lab Results  Component Value Date   TRIG 110.0 09/28/2020   Lab Results  Component Value Date   CHOLHDL 3 09/28/2020   Lab Results  Component Value Date   HGBA1C 5.7 (H) 09/12/2016      Assessment & Plan:   Problem List Items Addressed This Visit       Musculoskeletal and Integument   Age-related osteoporosis without current pathological fracture    Striae of same.  Last DEXA scan was in 2018.  Patient declined osteoporosis medication at that time.  Repeat DEXA scan      Relevant Orders   DG Bone Density   VITAMIN D 25 Hydroxy (Vit-D Deficiency, Fractures) (Completed)     Other   Establishing care with new doctor, encounter for - Primary    Reviewed personal, family, and surgical history with patient.  Did discuss social history.  Patient likely get labs done for the year.  Orders placed pending results      Relevant Orders   CBC with Differential/Platelet  (Completed)   Comprehensive metabolic panel (Completed)   Lipid panel (Completed)   Encounter for screening mammogram for malignant neoplasm of breast    Time for annual mammogram order placed.      Relevant Orders   MM Digital Screening   Elevated BP without diagnosis of hypertension    Patient with elevated blood pressure in office.  States she does check it at home and within normal limits when she does it.  Continue to monitor      Relevant Orders   CBC with Differential/Platelet (Completed)   Lipid panel (Completed)    No orders of the defined types were placed in this encounter.   Follow-up: Follow up 1 year for CPE and Labs, sooner if needed   Romilda Garret, NP

## 2021-09-06 NOTE — Assessment & Plan Note (Signed)
Patient with elevated blood pressure in office.  States she does check it at home and within normal limits when she does it.  Continue to monitor

## 2021-09-06 NOTE — Assessment & Plan Note (Signed)
Reviewed personal, family, and surgical history with patient.  Did discuss social history.  Patient likely get labs done for the year.  Orders placed pending results

## 2021-09-06 NOTE — Assessment & Plan Note (Signed)
Striae of same.  Last DEXA scan was in 2018.  Patient declined osteoporosis medication at that time.  Repeat DEXA scan

## 2021-09-06 NOTE — Patient Instructions (Signed)
Nice to see you today Caitlyn Martinez be in touch with the lab results Follow up in 1 year. Sooner if needed  I placed the order for your mammogram and bone density scan. Just call [place] [facility]

## 2021-09-06 NOTE — Assessment & Plan Note (Signed)
Time for annual mammogram order placed.

## 2021-09-30 ENCOUNTER — Encounter: Payer: Medicare Other | Admitting: Internal Medicine

## 2021-09-30 ENCOUNTER — Ambulatory Visit: Payer: Medicare Other | Admitting: Adult Health

## 2021-10-20 ENCOUNTER — Other Ambulatory Visit: Payer: Medicare Other

## 2021-11-17 ENCOUNTER — Other Ambulatory Visit: Payer: Self-pay

## 2021-11-17 ENCOUNTER — Ambulatory Visit
Admission: RE | Admit: 2021-11-17 | Discharge: 2021-11-17 | Disposition: A | Discharge status: Home / Self Care | Payer: Medicare Other | Source: Ambulatory Visit | Attending: Nurse Practitioner | Admitting: Nurse Practitioner

## 2021-11-17 DIAGNOSIS — Z1231 Encounter for screening mammogram for malignant neoplasm of breast: Secondary | ICD-10-CM | POA: Insufficient documentation

## 2021-11-17 DIAGNOSIS — Z780 Asymptomatic menopausal state: Secondary | ICD-10-CM | POA: Diagnosis not present

## 2021-11-17 DIAGNOSIS — M810 Age-related osteoporosis without current pathological fracture: Secondary | ICD-10-CM | POA: Diagnosis not present

## 2021-11-17 DIAGNOSIS — H2513 Age-related nuclear cataract, bilateral: Secondary | ICD-10-CM | POA: Diagnosis not present

## 2021-11-17 DIAGNOSIS — H40013 Open angle with borderline findings, low risk, bilateral: Secondary | ICD-10-CM | POA: Diagnosis not present

## 2021-11-17 DIAGNOSIS — M8589 Other specified disorders of bone density and structure, multiple sites: Secondary | ICD-10-CM | POA: Diagnosis not present

## 2021-11-19 ENCOUNTER — Other Ambulatory Visit: Payer: Self-pay | Admitting: Nurse Practitioner

## 2021-11-19 ENCOUNTER — Telehealth: Payer: Self-pay | Admitting: Nurse Practitioner

## 2021-11-19 DIAGNOSIS — M810 Age-related osteoporosis without current pathological fracture: Secondary | ICD-10-CM

## 2021-11-19 MED ORDER — ALENDRONATE SODIUM 70 MG PO TABS: 70.0000 mg | ORAL_TABLET | ORAL | 5 refills | Status: DC

## 2021-11-19 NOTE — Telephone Encounter (Signed)
Medication sent to pharmacy  

## 2021-11-19 NOTE — Telephone Encounter (Signed)
-----   Message from Francella Solian, [place] sent at 11/19/2021  1:59 PM EDT ----- ?Called patient reviewed all information and repeated back to me. Will call if any questions.  ?She would like to have called in so that she can get cost of medications. Will let our office know if she is not able to get.  ? ?[facility]  ?

## 2021-11-22 NOTE — Telephone Encounter (Signed)
Patient advised.

## 2021-12-13 DIAGNOSIS — D2272 Melanocytic nevi of left lower limb, including hip: Secondary | ICD-10-CM | POA: Diagnosis not present

## 2021-12-13 DIAGNOSIS — D2262 Melanocytic nevi of left upper limb, including shoulder: Secondary | ICD-10-CM | POA: Diagnosis not present

## 2021-12-13 DIAGNOSIS — L570 Actinic keratosis: Secondary | ICD-10-CM | POA: Diagnosis not present

## 2021-12-13 DIAGNOSIS — X32XXXA Exposure to sunlight, initial encounter: Secondary | ICD-10-CM | POA: Diagnosis not present

## 2021-12-13 DIAGNOSIS — D0472 Carcinoma in situ of skin of left lower limb, including hip: Secondary | ICD-10-CM | POA: Diagnosis not present

## 2021-12-13 DIAGNOSIS — D2261 Melanocytic nevi of right upper limb, including shoulder: Secondary | ICD-10-CM | POA: Diagnosis not present

## 2021-12-13 DIAGNOSIS — D485 Neoplasm of uncertain behavior of skin: Secondary | ICD-10-CM | POA: Diagnosis not present

## 2021-12-13 DIAGNOSIS — Z85828 Personal history of other malignant neoplasm of skin: Secondary | ICD-10-CM | POA: Diagnosis not present

## 2022-01-03 ENCOUNTER — Ambulatory Visit (INDEPENDENT_AMBULATORY_CARE_PROVIDER_SITE_OTHER): Payer: Medicare Other

## 2022-01-03 VITALS — Ht 63.0 in | Wt 141.0 lb

## 2022-01-03 DIAGNOSIS — Z0000 Encounter for general adult medical examination without abnormal findings: Secondary | ICD-10-CM | POA: Diagnosis not present

## 2022-01-03 NOTE — Progress Notes (Signed)
? ?Subjective:  ? Caitlyn Martinez is a 71 y.o. female who presents for Medicare Annual (Subsequent) preventive examination. ? ?Review of Systems    ?Virtual Visit via Telephone Note ? ?I connected with  Caitlyn Martinez on 01/03/22 at  3:15 PM EDT by telephone and verified that I am speaking with the correct person using two identifiers. ? ?Location: ?Patient: Home ?Provider: Office ?Persons participating in the virtual visit: patient/Nurse Health Advisor ?  ?I discussed the limitations, risks, security and privacy concerns of performing an evaluation and management service by telephone and the availability of in person appointments. The patient expressed understanding and agreed to proceed. ? ?Interactive audio and video telecommunications were attempted between this nurse and patient, however failed, due to patient having technical difficulties OR patient did not have access to video capability.  We continued and completed visit with audio only. ? ?Some vital signs may be absent or patient reported.  ? ?Criselda Peaches, LPN  ?  ? ?   ?Objective:  ?  ?There were no vitals filed for this visit. ?There is no height or weight on file to calculate BMI. ? ?   ? View : No data to display.  ?  ?  ?  ? ? ?Current Medications (verified) ?Outpatient Encounter Medications as of 01/03/2022  ?Medication Sig  ? alendronate (FOSAMAX) 70 MG tablet Take 1 tablet (70 mg total) by mouth once a week. Take with a full glass of water on an empty stomach. Sit up right after for 45 mins  ? augmented betamethasone dipropionate (DIPROLENE-AF) 0.05 % cream As needed  ? diphenhydrAMINE (BENADRYL ALLERGY) 25 mg capsule Take 25 mg by mouth at bedtime.  ? Magnesium Citrate 200 MG TABS   ? ?No facility-administered encounter medications on file as of 01/03/2022.  ? ? ?Allergies (verified) ?Patient has no known allergies.  ? ?History: ?Past Medical History:  ?Diagnosis Date  ? Arthritis   ? Chicken pox   ? ?Past Surgical History:  ?Procedure Laterality  Date  ? RHINOPLASTY    ? TONSILLECTOMY AND ADENOIDECTOMY    ? TUBAL LIGATION  1987  ? ?Family History  ?Problem Relation Age of Onset  ? Cancer Mother   ?     Breast  ? Breast cancer Mother 83  ? Diabetes Father   ? Alcohol abuse Father   ? Cancer Father   ?     Lung and Prostate  ? Arthritis Son   ? ?Social History  ? ?Socioeconomic History  ? Marital status: Married  ?  Spouse name: Not on file  ? Number of children: Not on file  ? Years of education: Not on file  ? Highest education level: Not on file  ?Occupational History  ? Not on file  ?Tobacco Use  ? Smoking status: Never  ? Smokeless tobacco: Never  ?Substance and Sexual Activity  ? Alcohol use: Yes  ?  Comment: occasional beer  ? Drug use: No  ? Sexual activity: Not on file  ?Other Topics Concern  ? Not on file  ?Social History Narrative  ? Not on file  ? ?Social Determinants of Health  ? ?Financial Resource Strain: Not on file  ?Food Insecurity: Not on file  ?Transportation Needs: Not on file  ?Physical Activity: Not on file  ?Stress: Not on file  ?Social Connections: Not on file  ? ? ?Tobacco Counseling ?Counseling given: Not Answered ? ? ?Clinical Intake: ? ? ?Diabetic?  No ? ? Activities  of Daily Living ?   ? View : No data to display.  ?  ?  ?  ? ? ?Patient Care Team: ?Michela Pitcher, NP as PCP - General (Pain Medicine) ? ?Indicate any recent Medical Services you may have received from other than [facility] providers in the past year (date may be approximate). ? ?   ?Assessment:  ? This is a routine wellness examination for [facility]. ? ?Hearing/Vision screen ?No results found. ? ?Dietary issues and exercise activities discussed: ?  ? ? Goals Addressed   ?None ?  ? ?Depression Screen ? ?  09/06/2021  ? 11:06 AM 09/06/2021  ? 11:05 AM 09/28/2020  ?  9:42 AM 09/24/2019  ?  8:13 AM 09/17/2018  ?  8:26 AM 09/11/2017  ?  2:45 PM 09/23/2016  ?  2:23 PM  ?PHQ 2/9 Scores  ?PHQ - 2 Score 0 0 0 0 0 0 0  ?PHQ- 9 Score 2    0    ?  ?Fall Risk ? ?  09/28/2020  ?  9:42 AM 09/24/2019  ?   8:12 AM 09/11/2017  ?  2:45 PM 09/23/2016  ?  2:23 PM 09/12/2015  ?  7:34 PM  ?Fall Risk   ?Falls in the past year? 0 0 No No No  ?Number falls in past yr:  0     ? ? ?FALL RISK PREVENTION PERTAINING TO THE HOME: ? ?Any stairs in or around the home? Yes  ?If so, are there any without handrails? No  ?Home free of loose throw rugs in walkways, pet beds, electrical cords, etc? Yes  ?Adequate lighting in your home to reduce risk of falls? Yes  ? ?ASSISTIVE DEVICES UTILIZED TO PREVENT FALLS: ? ?Life alert? No  ?Use of a cane, walker or w/c? No  ?Grab bars in the bathroom? Yes  ?Shower chair or bench in shower? Yes  ?Elevated toilet seat or a handicapped toilet? No  ? ?TIMED UP AND GO: ? ?Was the test performed? No . Audio Visit ? ?Cognitive Function: ?   ?  ? ?Immunizations ?Immunization History  ?Administered Date(s) Administered  ? Influenza, High Dose Seasonal PF 06/01/2020, 06/14/2021  ? Influenza,inj,Quad PF,6+ Mos 09/04/2014, 09/11/2015, 10/03/2016, 09/11/2017, 05/30/2018, 05/31/2019  ? Moderna SARS-COV2 Booster Vaccination 07/13/2021  ? Moderna Sars-Covid-2 Vaccination 10/03/2019, 10/31/2019, 06/29/2020  ? Pneumococcal Conjugate-13 09/11/2017  ? Pneumococcal Polysaccharide-23 09/17/2018  ? Pneumococcal-Unspecified 06/14/2021  ? Tdap 09/25/2014  ? Zoster, Live 09/11/2014  ? ? ?TDAP status: Up to date ? ?Flu Vaccine status: Up to date ? ?Pneumococcal vaccine status: Up to date ? ?Covid-19 vaccine status: Completed vaccines ? ?Qualifies for Shingles Vaccine? Yes   ?Zostavax completed Yes   ?Shingrix Completed?: Yes ? ?Screening Tests ?Health Maintenance  ?Topic Date Due  ? Zoster Vaccines- Shingrix (1 of 2) Never done  ? COVID-19 Vaccine (4 - Booster for Moderna series) 09/07/2021  ? INFLUENZA VACCINE  03/29/2022  ? MAMMOGRAM  11/18/2023  ? TETANUS/TDAP  09/25/2024  ? COLONOSCOPY (Pts 45-22yr Insurance coverage will need to be confirmed)  10/09/2024  ? Pneumonia Vaccine 70 Years old  Completed  ? DEXA SCAN  Completed   ? Hepatitis C Screening  Completed  ? HPV VACCINES  Aged Out  ? ? ?Health Maintenance ? ?Health Maintenance Due  ?Topic Date Due  ? Zoster Vaccines- Shingrix (1 of 2) Never done  ? COVID-19 Vaccine (4 - Booster for Moderna series) 09/07/2021  ? ? ?Colorectal cancer screening: Type of  screening: Colonoscopy. Completed 10/09/14. Repeat every 10 years ? ?Mammogram status: Completed 11/17/21. Repeat every year ? ?Bone Density status: Completed 10/2221. Results reflect: Bone density results: OSTEOPOROSIS. Repeat every   years. ? ?Lung Cancer Screening: (Low Dose CT Chest recommended if Age 55-80 years, 30 pack-year currently smoking OR have quit w/in 15years.) does not qualify.  ? ? ? ?Additional Screening: ? ?Hepatitis C Screening: does qualify; Completed 09/11/15 ? ?Vision Screening: Recommended annual ophthalmology exams for early detection of glaucoma and other disorders of the eye. ?Is the patient up to date with their annual eye exam?  Yes  ?Who is the provider or what is the name of the office in which the patient attends annual eye exams? [facility] ?If pt is not established with a provider, would they like to be referred to a provider to establish care? No .  ? ?Dental Screening: Recommended annual dental exams for proper oral hygiene ? ?Community Resource Referral / Chronic Care Management: ? ?CRR required this visit?  No  ? ?CCM required this visit?  No  ? ? ?  ?Plan:  ?  ? ?I have personally reviewed and noted the following in the patient?s chart:  ? ?Medical and social history ?Use of alcohol, tobacco or illicit drugs  ?Current medications and supplements including opioid prescriptions.  ?Functional ability and status ?Nutritional status ?Physical activity ?Advanced directives ?List of other physicians ?Hospitalizations, surgeries, and ER visits in previous 12 months ?Vitals ?Screenings to include cognitive, depression, and falls ?Referrals and appointments ? ?In addition, I have reviewed and discussed  with patient certain preventive protocols, quality metrics, and best practice recommendations. A written personalized care plan for preventive services as well as general preventive health recommenda

## 2022-01-03 NOTE — Patient Instructions (Addendum)
?Ms. Gellner , ?Thank you for taking time to come for your Medicare Wellness Visit. I appreciate your ongoing commitment to your health goals. Please review the following plan we discussed and let me know if I can assist you in the future.  ? ?These are the goals we discussed: ? Goals   ? ?   No current goals (pt-stated)   ? ?  ?  ?This is a list of the screening recommended for you and due dates:  ?Health Maintenance  ?Topic Date Due  ? COVID-19 Vaccine (4 - Booster for Moderna series) 01/19/2022*  ? Zoster (Shingles) Vaccine (2 of 2) 01/04/2022  ? Flu Shot  03/29/2022  ? Mammogram  11/18/2023  ? Tetanus Vaccine  09/25/2024  ? Colon Cancer Screening  10/09/2024  ? Pneumonia Vaccine  Completed  ? DEXA scan (bone density measurement)  Completed  ? Hepatitis C Screening: USPSTF Recommendation to screen - Ages 24-79 yo.  Completed  ? HPV Vaccine  Aged Out  ?*Topic was postponed. The date shown is not the original due date.  ?  ?Advanced directives: No  ? ?Conditions/risks identified: None ? ?Next appointment: Follow up in one year for your annual wellness visit  ? ? ? ?Preventive Care 70 Years and Older, Female ?Preventive care refers to lifestyle choices and visits with your health care provider that can promote health and wellness. ?What does preventive care include? ?A yearly physical exam. This is also called an annual well check. ?Dental exams once or twice a year. ?Routine eye exams. Ask your health care provider how often you should have your eyes checked. ?Personal lifestyle choices, including: ?Daily care of your teeth and gums. ?Regular physical activity. ?Eating a healthy diet. ?Avoiding tobacco and drug use. ?Limiting alcohol use. ?Practicing safe sex. ?Taking low-dose aspirin every day. ?Taking vitamin and mineral supplements as recommended by your health care provider. ?What happens during an annual well check? ?The services and screenings done by your health care provider during your annual well check  will depend on your age, overall health, lifestyle risk factors, and family history of disease. ?Counseling  ?Your health care provider may ask you questions about your: ?Alcohol use. ?Tobacco use. ?Drug use. ?Emotional well-being. ?Home and relationship well-being. ?Sexual activity. ?Eating habits. ?History of falls. ?Memory and ability to understand (cognition). ?Work and work Statistician. ?Reproductive health. ?Screening  ?You may have the following tests or measurements: ?Height, weight, and BMI. ?Blood pressure. ?Lipid and cholesterol levels. These may be checked every 5 years, or more frequently if you are over 1 years old. ?Skin check. ?Lung cancer screening. You may have this screening every year starting at age 55 if you have a 30-pack-year history of smoking and currently smoke or have quit within the past 15 years. ?Fecal occult blood test (FOBT) of the stool. You may have this test every year starting at age 74. ?Flexible sigmoidoscopy or colonoscopy. You may have a sigmoidoscopy every 5 years or a colonoscopy every 10 years starting at age 15. ?Hepatitis C blood test. ?Hepatitis B blood test. ?Sexually transmitted disease (STD) testing. ?Diabetes screening. This is done by checking your blood sugar (glucose) after you have not eaten for a while (fasting). You may have this done every 1-3 years. ?Bone density scan. This is done to screen for osteoporosis. You may have this done starting at age 34. ?Mammogram. This may be done every 1-2 years. Talk to your health care provider about how often you should have regular mammograms. ?  Talk with your health care provider about your test results, treatment options, and if necessary, the need for more tests. ?Vaccines  ?Your health care provider may recommend certain vaccines, such as: ?Influenza vaccine. This is recommended every year. ?Tetanus, diphtheria, and acellular pertussis (Tdap, Td) vaccine. You may need a Td booster every 10 years. ?Zoster vaccine. You  may need this after age 58. ?Pneumococcal 13-valent conjugate (PCV13) vaccine. One dose is recommended after age 21. ?Pneumococcal polysaccharide (PPSV23) vaccine. One dose is recommended after age 28. ?Talk to your health care provider about which screenings and vaccines you need and how often you need them. ?This information is not intended to replace advice given to you by your health care provider. Make sure you discuss any questions you have with your health care provider. ?Document Released: 09/11/2015 Document Revised: 05/04/2016 Document Reviewed: 06/16/2015 ?Elsevier Interactive Patient Education ? 2017 [place]. ? ?Fall Prevention in the Home ?Falls can cause injuries. They can happen to people of all ages. There are many things you can do to make your home safe and to help prevent falls. ?What can I do on the outside of my home? ?Regularly fix the edges of walkways and driveways and fix any cracks. ?Remove anything that might make you trip as you walk through a door, such as a raised step or threshold. ?Trim any bushes or trees on the path to your home. ?Use bright outdoor lighting. ?Clear any walking paths of anything that might make someone trip, such as rocks or tools. ?Regularly check to see if handrails are loose or broken. Make sure that both sides of any steps have handrails. ?Any raised decks and porches should have guardrails on the edges. ?Have any leaves, snow, or ice cleared regularly. ?Use sand or salt on walking paths during winter. ?Clean up any spills in your garage right away. This includes oil or grease spills. ?What can I do in the bathroom? ?Use night lights. ?Install grab bars by the toilet and in the tub and shower. Do not use towel bars as grab bars. ?Use non-skid mats or decals in the tub or shower. ?If you need to sit down in the shower, use a plastic, non-slip stool. ?Keep the floor dry. Clean up any water that spills on the floor as soon as it happens. ?Remove soap buildup  in the tub or shower regularly. ?Attach bath mats securely with double-sided non-slip rug tape. ?Do not have throw rugs and other things on the floor that can make you trip. ?What can I do in the bedroom? ?Use night lights. ?Make sure that you have a light by your bed that is easy to reach. ?Do not use any sheets or blankets that are too big for your bed. They should not hang down onto the floor. ?Have a firm chair that has side arms. You can use this for support while you get dressed. ?Do not have throw rugs and other things on the floor that can make you trip. ?What can I do in the kitchen? ?Clean up any spills right away. ?Avoid walking on wet floors. ?Keep items that you use a lot in easy-to-reach places. ?If you need to reach something above you, use a strong step stool that has a grab bar. ?Keep electrical cords out of the way. ?Do not use floor polish or wax that makes floors slippery. If you must use wax, use non-skid floor wax. ?Do not have throw rugs and other things on the floor that can make  you trip. ?What can I do with my stairs? ?Do not leave any items on the stairs. ?Make sure that there are handrails on both sides of the stairs and use them. Fix handrails that are broken or loose. Make sure that handrails are as long as the stairways. ?Check any carpeting to make sure that it is firmly attached to the stairs. Fix any carpet that is loose or worn. ?Avoid having throw rugs at the top or bottom of the stairs. If you do have throw rugs, attach them to the floor with carpet tape. ?Make sure that you have a light switch at the top of the stairs and the bottom of the stairs. If you do not have them, ask someone to add them for you. ?What else can I do to help prevent falls? ?Wear shoes that: ?Do not have high heels. ?Have rubber bottoms. ?Are comfortable and fit you well. ?Are closed at the toe. Do not wear sandals. ?If you use a stepladder: ?Make sure that it is fully opened. Do not climb a closed  stepladder. ?Make sure that both sides of the stepladder are locked into place. ?Ask someone to hold it for you, if possible. ?Clearly mark and make sure that you can see: ?Any grab bars or handrails. ?First

## 2022-01-05 DIAGNOSIS — R350 Frequency of micturition: Secondary | ICD-10-CM | POA: Diagnosis not present

## 2022-01-05 DIAGNOSIS — R399 Unspecified symptoms and signs involving the genitourinary system: Secondary | ICD-10-CM | POA: Diagnosis not present

## 2022-01-05 DIAGNOSIS — N390 Urinary tract infection, site not specified: Secondary | ICD-10-CM | POA: Diagnosis not present

## 2022-01-05 DIAGNOSIS — R300 Dysuria: Secondary | ICD-10-CM | POA: Diagnosis not present

## 2022-01-11 DIAGNOSIS — D0472 Carcinoma in situ of skin of left lower limb, including hip: Secondary | ICD-10-CM | POA: Diagnosis not present

## 2022-04-24 ENCOUNTER — Other Ambulatory Visit: Payer: Self-pay | Admitting: Nurse Practitioner

## 2022-04-24 DIAGNOSIS — M810 Age-related osteoporosis without current pathological fracture: Secondary | ICD-10-CM

## 2022-06-16 DIAGNOSIS — Z23 Encounter for immunization: Secondary | ICD-10-CM | POA: Diagnosis not present

## 2022-07-01 DIAGNOSIS — Z85828 Personal history of other malignant neoplasm of skin: Secondary | ICD-10-CM | POA: Diagnosis not present

## 2022-07-01 DIAGNOSIS — D2262 Melanocytic nevi of left upper limb, including shoulder: Secondary | ICD-10-CM | POA: Diagnosis not present

## 2022-07-01 DIAGNOSIS — D0472 Carcinoma in situ of skin of left lower limb, including hip: Secondary | ICD-10-CM | POA: Diagnosis not present

## 2022-07-01 DIAGNOSIS — D485 Neoplasm of uncertain behavior of skin: Secondary | ICD-10-CM | POA: Diagnosis not present

## 2022-07-01 DIAGNOSIS — D2261 Melanocytic nevi of right upper limb, including shoulder: Secondary | ICD-10-CM | POA: Diagnosis not present

## 2022-07-01 DIAGNOSIS — L821 Other seborrheic keratosis: Secondary | ICD-10-CM | POA: Diagnosis not present

## 2022-07-01 DIAGNOSIS — D225 Melanocytic nevi of trunk: Secondary | ICD-10-CM | POA: Diagnosis not present

## 2022-08-03 ENCOUNTER — Ambulatory Visit (INDEPENDENT_AMBULATORY_CARE_PROVIDER_SITE_OTHER)
Admission: RE | Admit: 2022-08-03 | Discharge: 2022-08-03 | Disposition: A | Discharge status: Home / Self Care | Payer: Medicare Other | Source: Ambulatory Visit | Attending: Nurse Practitioner | Admitting: Nurse Practitioner

## 2022-08-03 ENCOUNTER — Ambulatory Visit (INDEPENDENT_AMBULATORY_CARE_PROVIDER_SITE_OTHER): Payer: Medicare Other | Admitting: Nurse Practitioner

## 2022-08-03 VITALS — BP 120/82 | HR 83 | Temp 98.0°F | Ht 63.0 in | Wt 146.0 lb

## 2022-08-03 DIAGNOSIS — K5909 Other constipation: Secondary | ICD-10-CM

## 2022-08-03 DIAGNOSIS — R109 Unspecified abdominal pain: Secondary | ICD-10-CM | POA: Diagnosis not present

## 2022-08-03 DIAGNOSIS — K5900 Constipation, unspecified: Secondary | ICD-10-CM | POA: Diagnosis not present

## 2022-08-03 DIAGNOSIS — M545 Low back pain, unspecified: Secondary | ICD-10-CM | POA: Diagnosis not present

## 2022-08-03 NOTE — Patient Instructions (Signed)
Nice to see you today I will be in touch with the xray once I have the results Keep your appointment with me in January for your physical

## 2022-08-03 NOTE — Progress Notes (Signed)
Acute Office Visit  Subjective:     Patient ID: Caitlyn Martinez, female    DOB: 1951/05/30, 71 y.o.   MRN: [id number]  Chief Complaint  Patient presents with   Acute Visit    Back pain and bowel pain. Patient states she is not able to go very often and she has bad back pains due to this. It happens mainly in the mornings and starts to ease up during the day. Has tried taking stool softeners and miralax but they dont help much.     HPI Patient is in today for back pain and constipation   States that she has always had an issue with more constipation with her stool. For over approx the past 10 years. States that she will not pop for 3-4 days and then will have a back ache. States that she will get nausea. She will poop then feel better. States that her stool is pelletly and hard. States that she did try to use her finger to get the stool but no success States that she has been taking magnesium daily. States that she is taking 2 tablets at night to help with sleep.  States that she does drink water Walks 5 miles a day. States that she does have an exercise routine that she does every morning.  States that it is worse in the morning and it is a sharp pain at the bottom of her spine. States improves with movement. States that she cannot stand long or she will feel it. She has tried stool softener 4 days ago and a little movement. States that she did it for three days in a row.  Review of Systems  Constitutional:  Negative for chills and fever.  Gastrointestinal:  Positive for abdominal pain, constipation and nausea. Negative for diarrhea and vomiting.        Objective:    BP 120/82 (BP Location: Left Arm, Patient Position: Sitting, Cuff Size: Normal)   Pulse 83   Temp 98 F (36.7 C) (Temporal)   Ht '5\' 3"'$  (1.6 m)   Wt 146 lb (66.2 kg)   SpO2 98%   BMI 25.86 kg/m    Physical Exam Vitals and nursing note reviewed.  Constitutional:      Appearance: Normal appearance.   Cardiovascular:     Rate and Rhythm: Normal rate and regular rhythm.     Pulses: Normal pulses.     Heart sounds: Normal heart sounds.  Pulmonary:     Effort: Pulmonary effort is normal.     Breath sounds: Normal breath sounds.  Abdominal:     General: Bowel sounds are normal. There is no distension.     Palpations: There is no mass.     Tenderness: There is abdominal tenderness.     Hernia: No hernia is present.    Musculoskeletal:       Back:  Neurological:     Mental Status: She is alert.     No results found for any visits on 08/03/22.      Assessment & Plan:   Problem List Items Addressed This Visit       Other   Other constipation - Primary    Patient likely constipated no fecal impaction felt on DRE today.  Patient has been using over-the-counter bowel preps without relief.  States she does take take magnesium unsure of how much milligram lies.  Will obtain abdomen 1 view today pending result if shows constipation we will get patient to try  magnesium citrate.  And then we consider using Linzess for daily bowel movements.      Relevant Orders   DG Abd 1 View    No orders of the defined types were placed in this encounter.   Return if symptoms worsen or fail to improve, for as scheduled.  Romilda Garret, NP

## 2022-08-03 NOTE — Assessment & Plan Note (Signed)
Patient likely constipated no fecal impaction felt on DRE today.  Patient has been using over-the-counter bowel preps without relief.  States she does take take magnesium unsure of how much milligram lies.  Will obtain abdomen 1 view today pending result if shows constipation we will get patient to try magnesium citrate.  And then we consider using Linzess for daily bowel movements.

## 2022-08-04 ENCOUNTER — Telehealth: Payer: Self-pay | Admitting: Nurse Practitioner

## 2022-08-04 DIAGNOSIS — K5909 Other constipation: Secondary | ICD-10-CM

## 2022-08-04 NOTE — Telephone Encounter (Signed)
Patient called to get a call back about her diagnosis. Call back number [phone].

## 2022-08-04 NOTE — Telephone Encounter (Signed)
Rtn pt's call.  States she tried magnesium citrate and it helped move her bowels.  However, pt does not want to go this route every time she is constipated. She wants to try Linzess and asks for rx be sent to [facility].   Also, pt wanted to let Catalina Antigua know she takes 2 tablets daily of magnesium 400 mg/calcium 1000 MG/zinc 15 mg tab.

## 2022-08-05 DIAGNOSIS — D0472 Carcinoma in situ of skin of left lower limb, including hip: Secondary | ICD-10-CM | POA: Diagnosis not present

## 2022-08-05 MED ORDER — LINACLOTIDE 72 MCG PO CAPS: 72.0000 ug | ORAL_CAPSULE | Freq: Every day | ORAL | 0 refills | Status: DC

## 2022-08-05 NOTE — Addendum Note (Signed)
Addended by: Michela Pitcher on: 08/05/2022 01:21 PM   Modules accepted: Orders

## 2022-08-05 NOTE — Telephone Encounter (Signed)
Script sent to pharmacy.

## 2022-08-05 NOTE — Telephone Encounter (Signed)
Patient notified as instructed by telephone and verbalized understanding. Patient stated that she has found out the medication is expensive so not sure that she will get it. Patient stated that she is going to shop around and see if she can get a better price at another pharmacy. Patient was advised if she can find a cheaper price they should be able to transfer the script.Patient had questions about the other findings on the x-ray. Patient stated that she has had some back pain and will follow-up with [facility] if her back continues to bother her.

## 2022-09-08 ENCOUNTER — Ambulatory Visit (INDEPENDENT_AMBULATORY_CARE_PROVIDER_SITE_OTHER): Payer: Medicare Other | Admitting: Nurse Practitioner

## 2022-09-08 ENCOUNTER — Encounter: Payer: Self-pay | Admitting: Nurse Practitioner

## 2022-09-08 VITALS — BP 112/62 | HR 75 | Ht 63.0 in | Wt 146.0 lb

## 2022-09-08 DIAGNOSIS — K5909 Other constipation: Secondary | ICD-10-CM | POA: Diagnosis not present

## 2022-09-08 DIAGNOSIS — M810 Age-related osteoporosis without current pathological fracture: Secondary | ICD-10-CM | POA: Diagnosis not present

## 2022-09-08 DIAGNOSIS — E663 Overweight: Secondary | ICD-10-CM | POA: Diagnosis not present

## 2022-09-08 DIAGNOSIS — H9190 Unspecified hearing loss, unspecified ear: Secondary | ICD-10-CM

## 2022-09-08 LAB — COMPREHENSIVE METABOLIC PANEL
ALT: 12 U/L (ref 0–35)
AST: 18 U/L (ref 0–37)
Albumin: 4.2 g/dL (ref 3.5–5.2)
Alkaline Phosphatase: 66 U/L (ref 39–117)
BUN: 11 mg/dL (ref 6–23)
CO2: 32 mEq/L (ref 19–32)
Calcium: 9.5 mg/dL (ref 8.4–10.5)
Chloride: 99 mEq/L (ref 96–112)
Creatinine, Ser: 0.82 mg/dL (ref 0.40–1.20)
GFR: 72.43 mL/min (ref >60.00)
Glucose, Bld: 97 mg/dL (ref 70–99)
Potassium: 4.4 mEq/L (ref 3.5–5.1)
Sodium: 136 mEq/L (ref 135–145)
Total Bilirubin: 0.4 mg/dL (ref 0.2–1.2)
Total Protein: 7.1 g/dL (ref 6.0–8.3)

## 2022-09-08 LAB — CBC
HCT: 39.8 % (ref 36.0–46.0)
Hemoglobin: 13.2 g/dL (ref 12.0–15.0)
MCHC: 33.3 g/dL (ref 30.0–36.0)
MCV: 94.5 fl (ref 78.0–100.0)
Platelets: 235 10*3/uL (ref 150.0–400.0)
RBC: 4.21 Mil/uL (ref 3.87–5.11)
RDW: 12.5 % (ref 11.5–15.5)
WBC: 4.3 10*3/uL (ref 4.0–10.5)

## 2022-09-08 LAB — VITAMIN D 25 HYDROXY (VIT D DEFICIENCY, FRACTURES): VITD: 45.93 ng/mL (ref 30.00–100.00)

## 2022-09-08 LAB — LIPID PANEL
Cholesterol: 168 mg/dL (ref 0–200)
HDL: 63.7 mg/dL (ref >39.00)
LDL Cholesterol: 82 mg/dL (ref 0–99)
NonHDL: 104.15
Total CHOL/HDL Ratio: 3
Triglycerides: 109 mg/dL (ref 0.0–149.0)
VLDL: 21.8 mg/dL (ref 0.0–40.0)

## 2022-09-08 MED ORDER — LUBIPROSTONE 24 MCG PO CAPS: 24.0000 ug | ORAL_CAPSULE | Freq: Two times a day (BID) | ORAL | 0 refills | Status: DC

## 2022-09-08 NOTE — Progress Notes (Signed)
Established Patient Office Visit  Subjective   Patient ID: Caitlyn Martinez, female    DOB: Sep 20, 1950  Age: 72 y.o. MRN: [id number]  Chief Complaint  Patient presents with   Annual Exam    HPI  Osteoporosis: Patient currently taking calcium along with her alendronate.  She is tolerating medication well.  Last DEXA scan 2023.  Constipation: states that linzess was not covered.  She had the use of magnesium citrate since her last visit to clean herself out.  She been using milk of magnesia currently but every other day to help her have a bowel movement.  This has been a longstanding issue per patient report   Immunizations: -Tetanus: Completed in 2016 -Influenza: Completed this season 06/16/2022 -Shingles: 11/09/2021 -Pneumonia: UTD - covid: utd -HPV: aged out  Diet: [place]. 3 meals a day with some snacks. States that she will drink sweet tes, try to drink water. Coffee. Exercise: [place] walk a lot. States that she will walk on the treadmill. 1 mile then rest and then another. Will get 3 miles generally. About everyday   Eye exam: over the counter readers, as needed. Dental exam: Completes semi-annually   Pap Smear: Completed in 2018 that was normal. Aged out Mammogram: Completed in 11/17/2021, repeat in 1 year  Colonoscopy: Completed in 2016 Lung Cancer Screening: N/A Dexa: Completed in 11/17/2021, on alendronate, repeat 2025  Sleep: states that she will go to bed around 11 and get up aroun 630-7. Feels rested. States that she does not sleep well. States that she will get up and down. 1 time of nocturia.  Hearing Screening  Method: Audiometry   '500Hz'$  '1000Hz'$  '2000Hz'$  '4000Hz'$   Right ear Fail Fail Fail Fail  Left ear Fail Fail Fail Fail  Comments: PT FAILED ALL LEVELS AT 20DCB; PT PASSED AT 40DCB     Review of Systems  Constitutional:  Negative for chills and fever.  Respiratory:  Negative for shortness of breath.   Cardiovascular:  Negative  for chest pain and leg swelling.  Gastrointestinal:  Positive for constipation. Negative for abdominal pain, blood in stool, diarrhea, nausea and vomiting.       States that she will have a BM every 2 days with milk of mag  Genitourinary:  Negative for dysuria and hematuria.  Neurological:  Negative for tingling and headaches.  Psychiatric/Behavioral:  Negative for hallucinations and suicidal ideas.       Objective:     BP 112/62   Pulse 75   Ht '5\' 3"'$  (1.6 m)   Wt 146 lb (66.2 kg)   SpO2 95%   BMI 25.86 kg/m    Physical Exam Vitals and nursing note reviewed.  Constitutional:      Appearance: Normal appearance.  HENT:     Right Ear: Tympanic membrane, ear canal and external ear normal.     Left Ear: Tympanic membrane, ear canal and external ear normal.     Mouth/Throat:     Mouth: Mucous membranes are moist.     Pharynx: Oropharynx is clear.  Eyes:     Extraocular Movements: Extraocular movements intact.     Pupils: Pupils are equal, round, and reactive to light.  Cardiovascular:     Rate and Rhythm: Normal rate and regular rhythm.     Pulses: Normal pulses.     Heart sounds: Normal heart sounds.  Pulmonary:     Effort: Pulmonary effort is normal.     Breath sounds:  Normal breath sounds.  Abdominal:     General: Bowel sounds are normal. There is no distension.     Palpations: There is no mass.     Tenderness: There is no abdominal tenderness.     Hernia: No hernia is present.  Musculoskeletal:     Right lower leg: No edema.     Left lower leg: No edema.  Lymphadenopathy:     Cervical: No cervical adenopathy.  Skin:    General: Skin is warm.  Neurological:     General: No focal deficit present.     Mental Status: She is alert.     Deep Tendon Reflexes:     Reflex Scores:      Bicep reflexes are 2+ on the right side and 2+ on the left side.      Patellar reflexes are 2+ on the right side and 2+ on the left side.    Comments: Bilateral upper and lower extremity  strength 5/5  Psychiatric:        Mood and Affect: Mood normal.        Behavior: Behavior normal.        Thought Content: Thought content normal.        Judgment: Judgment normal.      No results found for any visits on 09/08/22.    The 10-year ASCVD risk score (Arnett DK, et al., 2019) is: 7%    Assessment & Plan:   Problem List Items Addressed This Visit       Musculoskeletal and Integument   Age-related osteoporosis without current pathological fracture - Primary    Patient currently on calcium supplement along with Fosamax.  Last vitamin D level within normal limits recheck today.  Next DEXA scan due in 2025      Relevant Orders   Comprehensive metabolic panel   CBC   VITAMIN D 25 Hydroxy (Vit-D Deficiency, Fractures)     Other   Other constipation    Longstanding history of constipation.  Patient using milk of magnesia currently did offer symptomatic relief we did try Linzess but too expensive.  Will try Amitiza to see if this is more portable.  Patient did bring in PAP for Linzess      Relevant Medications   lubiprostone (AMITIZA) 24 MCG capsule   Other Relevant Orders   Comprehensive metabolic panel   CBC   Overweight    Patient is currently walking 3 miles a day.  Continue healthy lifestyle modification      Relevant Orders   Lipid panel   Decreased hearing    Daughter complains about decreased hearing.  Patient is able to track conversation office with masks on.  She did fail all hearing on 20 dB but heard all 40 dB.  Offered patient formal audiology referral patient politely declined at this juncture       Return in about 1 year (around 09/09/2023) for CPE and Labs.    Romilda Garret, NP

## 2022-09-08 NOTE — Assessment & Plan Note (Signed)
Daughter complains about decreased hearing.  Patient is able to track conversation office with masks on.  She did fail all hearing on 20 dB but heard all 40 dB.  Offered patient formal audiology referral patient politely declined at this juncture

## 2022-09-08 NOTE — Patient Instructions (Addendum)
Nice to see you today I have sent in a new medication to try for the constipation You do have some hearing loss I want to see you in 1 year, sooner if you need me

## 2022-09-08 NOTE — Assessment & Plan Note (Signed)
Longstanding history of constipation.  Patient using milk of magnesia currently did offer symptomatic relief we did try Linzess but too expensive.  Will try Amitiza to see if this is more portable.  Patient did bring in PAP for Linzess

## 2022-09-08 NOTE — Assessment & Plan Note (Signed)
Patient currently on calcium supplement along with Fosamax.  Last vitamin D level within normal limits recheck today.  Next DEXA scan due in 2025

## 2022-09-08 NOTE — Assessment & Plan Note (Signed)
Patient is currently walking 3 miles a day.  Continue healthy lifestyle modification

## 2022-09-21 ENCOUNTER — Other Ambulatory Visit: Payer: Self-pay | Admitting: Nurse Practitioner

## 2022-09-21 DIAGNOSIS — M810 Age-related osteoporosis without current pathological fracture: Secondary | ICD-10-CM

## 2022-10-04 ENCOUNTER — Other Ambulatory Visit: Payer: Self-pay | Admitting: Nurse Practitioner

## 2022-10-04 DIAGNOSIS — Z1231 Encounter for screening mammogram for malignant neoplasm of breast: Secondary | ICD-10-CM

## 2022-11-21 ENCOUNTER — Ambulatory Visit
Admission: RE | Admit: 2022-11-21 | Discharge: 2022-11-21 | Disposition: A | Discharge status: Home / Self Care | Payer: Medicare Other | Source: Ambulatory Visit | Attending: Nurse Practitioner | Admitting: Nurse Practitioner

## 2022-11-21 DIAGNOSIS — Z1231 Encounter for screening mammogram for malignant neoplasm of breast: Secondary | ICD-10-CM | POA: Diagnosis not present

## 2022-12-14 ENCOUNTER — Telehealth: Payer: Self-pay | Admitting: Nurse Practitioner

## 2022-12-14 NOTE — Telephone Encounter (Signed)
Paperwork placed in you office.

## 2022-12-14 NOTE — Telephone Encounter (Signed)
Pt droop off copy of Living Will for PCP to review. Place in PCP folder

## 2023-01-05 ENCOUNTER — Ambulatory Visit (INDEPENDENT_AMBULATORY_CARE_PROVIDER_SITE_OTHER): Payer: Medicare Other

## 2023-01-05 VITALS — Ht 64.0 in | Wt 146.0 lb

## 2023-01-05 DIAGNOSIS — Z0000 Encounter for general adult medical examination without abnormal findings: Secondary | ICD-10-CM | POA: Diagnosis not present

## 2023-01-05 NOTE — Progress Notes (Signed)
I connected with  Caitlyn Martinez on 01/05/23 by a audio enabled telemedicine application and verified that I am speaking with the correct person using two identifiers.  Patient Location: Home  Provider Location: Home Office  I discussed the limitations of evaluation and management by telemedicine. The patient expressed understanding and agreed to proceed.  Subjective:   Caitlyn Martinez is a 72 y.o. female who presents for Medicare Annual (Subsequent) preventive examination.  Review of Systems      Cardiac Risk Factors include: advanced age (>14men, >43 women)     Objective:    Today's Vitals   01/05/23 1030  Weight: 146 lb (66.2 kg)  Height: 5\' 4"  (1.626 m)   Body mass index is 25.06 kg/m.     01/05/2023   10:41 AM 01/03/2022    3:41 PM  Advanced Directives  Does Patient Have a Medical Advance Directive? Yes No  Type of [name];Living will   Does patient want to make changes to medical advance directive? No - Patient declined   Copy of Healthcare Power of Attorney in Chart? Yes - validated most recent copy scanned in chart (See row information)   Would patient like information on creating a medical advance directive?  No - Patient declined    Current Medications (verified) Outpatient Encounter Medications as of 01/05/2023  Medication Sig   augmented betamethasone dipropionate (DIPROLENE-AF) 0.05 % cream As needed   CALCIUM-MAGNESIUM-ZINC PO Take 2 tablets by mouth daily. Calcium 1000 mg/magnesium 400 mg/zinc 15 mg   diphenhydrAMINE (BENADRYL ALLERGY) 25 mg capsule Take 25 mg by mouth at bedtime.   alendronate (FOSAMAX) 70 MG tablet TAKE 1 TABLET BY MOUTH ONCE WEEKLY WITH A FULL GLASS OF WATER ON AN EMPTY STOMACH. SIT UP RIGHT FOR 45 MINUTES AFTER. (Patient not taking: Reported on 01/05/2023)   lubiprostone (AMITIZA) 24 MCG capsule Take 1 capsule (24 mcg total) by mouth 2 (two) times daily with a meal. (Patient not taking: Reported on  01/05/2023)   No facility-administered encounter medications on file as of 01/05/2023.    Allergies (verified) Patient has no known allergies.   History: Past Medical History:  Diagnosis Date   Arthritis    Chicken pox    Osteoporosis    Past Surgical History:  Procedure Laterality Date   RHINOPLASTY     TONSILLECTOMY AND ADENOIDECTOMY     TUBAL LIGATION  1987   Family History  Problem Relation Age of Onset   Cancer Mother        Breast   Breast cancer Mother 38   Diabetes Father    Alcohol abuse Father    Cancer Father        Lung and Prostate   Social History   Socioeconomic History   Marital status: Married    Spouse name: Caitlyn Martinez   Number of children: 2   Years of education: Not on file   Highest education level: Not on file  Occupational History   Not on file  Tobacco Use   Smoking status: Never   Smokeless tobacco: Never  Vaping Use   Vaping Use: Never used  Substance and Sexual Activity   Alcohol use: Yes    Comment: occasional beer   Drug use: No   Sexual activity: Not on file  Other Topics Concern   Not on file  Social History Narrative   Caitlyn Martinez 46   Caitlyn Martinez 37      Hobbies: Sewing, walking    Social  Determinants of Health   Financial Resource Strain: Low Risk  (01/05/2023)   Overall Financial Resource Strain (CARDIA)    Difficulty of Paying Living Expenses: Not hard at all  Food Insecurity: No Food Insecurity (01/05/2023)   Hunger Vital Sign    Worried About Running Out of Food in the Last Year: Never true    Ran Out of Food in the Last Year: Never true  Transportation Needs: No Transportation Needs (01/05/2023)   PRAPARE - Administrator, Civil Service (Medical): No    Lack of Transportation (Non-Medical): No  Physical Activity: Sufficiently Active (01/05/2023)   Exercise Vital Sign    Days of Exercise per Week: 7 days    Minutes of Exercise per Session: 60 min  Stress: No Stress Concern Present (01/05/2023)   [employer] of  Occupational Health - Occupational Stress Questionnaire    Feeling of Stress : Not at all  Social Connections: Moderately Integrated (01/05/2023)   Social Connection and Isolation Panel [NHANES]    Frequency of Communication with Friends and Family: More than three times a week    Frequency of Social Gatherings with Friends and Family: More than three times a week    Attends Religious Services: More than 4 times per year    Active Member of [employer] or Organizations: No    Attends Banker Meetings: Never    Marital Status: Married    Tobacco Counseling Counseling given: Not Answered   Clinical Intake:  Pre-visit preparation completed: Yes  Pain : No/denies pain     Nutritional Risks: Unintentional weight gain (gained approx. 10lbs in last 6 months) Diabetes: No  How often do you need to have someone help you when you read instructions, pamphlets, or other written materials from your doctor or pharmacy?: 1 - Never  Diabetic? no  Interpreter Needed?: No  Information entered by :: C.Yeraldin Litzenberger LPN   Activities of Daily Living    01/05/2023   10:42 AM  In your present state of health, do you have any difficulty performing the following activities:  Hearing? 0  Vision? 0  Difficulty concentrating or making decisions? 1  Comment occasionally  Walking or climbing stairs? 0  Dressing or bathing? 0  Doing errands, shopping? 0  Preparing Food and eating ? N  Using the Toilet? N  In the past six months, have you accidently leaked urine? Y  Comment occasionally  Do you have problems with loss of bowel control? N  Managing your Medications? N  Managing your Finances? N  Housekeeping or managing your Housekeeping? N    Patient Care Team: [name], NP as PCP - General (Pain Medicine)  Indicate any recent Medical Services you may have received from other than [facility] providers in the past year (date may be approximate).     Assessment:   This is a routine  wellness examination for [facility].  Hearing/Vision screen Hearing Screening - Comments:: none Vision Screening - Comments:: readers  Dietary issues and exercise activities discussed: Current Exercise Habits: Home exercise routine, Type of exercise: walking, Time (Minutes): 60, Frequency (Times/Week): 7, Weekly Exercise (Minutes/Week): 420, Exercise limited by: None identified   Goals Addressed             This Visit's Progress    Patient Stated       Lose 10 pounds       Depression Screen    01/05/2023   10:40 AM 01/03/2022    3:37 PM 09/06/2021  11:06 AM 09/06/2021   11:05 AM 09/28/2020    9:42 AM 09/24/2019    8:13 AM 09/17/2018    8:26 AM  PHQ 2/9 Scores  PHQ - 2 Score 0 0 0 0 0 0 0  PHQ- 9 Score   2    0    Fall Risk    01/05/2023   10:42 AM 01/03/2022    3:39 PM 09/28/2020    9:42 AM 09/24/2019    8:12 AM 09/11/2017    2:45 PM  Fall Risk   Falls in the past year? 1 0 0 0 No  Number falls in past yr: 1 0  0   Comment slipped on wet floor      Injury with Fall? 0 0     Risk for fall due to : No Fall Risks No Fall Risks     Follow up Falls prevention discussed;Falls evaluation completed        FALL RISK PREVENTION PERTAINING TO THE HOME:  Any stairs in or around the home? Yes  If so, are there any without handrails? No  Home free of loose throw rugs in walkways, pet beds, electrical cords, etc? Yes  Adequate lighting in your home to reduce risk of falls? Yes   ASSISTIVE DEVICES UTILIZED TO PREVENT FALLS:  Life alert? No  Use of a cane, walker or w/c? No  Grab bars in the bathroom? Yes  Shower chair or bench in shower? No  Elevated toilet seat or a handicapped toilet? Yes    Cognitive Function:        01/05/2023   10:43 AM 01/03/2022    3:42 PM  6CIT Screen  What Year? 0 points 0 points  What month? 0 points 0 points  What time? 0 points 0 points  Count back from 20 0 points 0 points  Months in reverse 0 points 0 points  Repeat phrase 0 points 0 points   Total Score 0 points 0 points    Immunizations Immunization History  Administered Date(s) Administered   COVID-19, mRNA, vaccine(Comirnaty)12 years and older 06/16/2022   Influenza, High Dose Seasonal PF 06/01/2020, 06/14/2021   Influenza,inj,Quad PF,6+ Mos 09/04/2014, 09/11/2015, 10/03/2016, 09/11/2017, 05/30/2018, 05/31/2019   Influenza-Unspecified 06/16/2022   Moderna SARS-COV2 Booster Vaccination 07/13/2021   Moderna Sars-Covid-2 Vaccination 10/03/2019, 10/31/2019, 06/29/2020   Pneumococcal Conjugate-13 09/11/2017   Pneumococcal Polysaccharide-23 09/17/2018   Pneumococcal-Unspecified 06/14/2021   Tdap 09/25/2014   Zoster Recombinat (Shingrix) 11/09/2021   Zoster, Live 09/11/2014    TDAP status: Up to date  Flu Vaccine status: Up to date  Pneumococcal vaccine status: Up to date  Covid-19 vaccine status: Completed vaccines  Qualifies for Shingles Vaccine? Yes   Zostavax completed No   Shingrix Completed?: Yes per pt   Screening Tests Health Maintenance  Topic Date Due   Zoster Vaccines- Shingrix (2 of 2) 01/04/2022   INFLUENZA VACCINE  03/30/2023   Medicare Annual Wellness (AWV)  01/05/2024   DTaP/Tdap/Td (2 - Td or Tdap) 09/25/2024   COLONOSCOPY (Pts 45-49yrs Insurance coverage will need to be confirmed)  10/09/2024   MAMMOGRAM  11/20/2024   Pneumonia Vaccine 8+ Years old  Completed   DEXA SCAN  Completed   COVID-19 Vaccine  Completed   Hepatitis C Screening  Completed   HPV VACCINES  Aged Out    Health Maintenance  Health Maintenance Due  Topic Date Due   Zoster Vaccines- Shingrix (2 of 2) 01/04/2022    Colorectal cancer screening: Type of  screening: Colonoscopy. Completed 10/09/14. Repeat every 10 years  Mammogram status: Completed 11/21/22. Repeat every year  Bone Density status: Completed 11/17/21. Results reflect: Bone density results: OSTEOPOROSIS. Repeat every 5 years.  Lung Cancer Screening: (Low Dose CT Chest recommended if Age 74-80 years,  30 pack-year currently smoking OR have quit w/in 15years.) does not qualify.   Lung Cancer Screening Referral: no  Additional Screening:  Hepatitis C Screening: does qualify; Completed 09/11/15  Vision Screening: Recommended annual ophthalmology exams for early detection of glaucoma and other disorders of the eye. Is the patient up to date with their annual eye exam?  Yes  Who is the provider or what is the name of the office in which the patient attends annual eye exams? [facility] If pt is not established with a provider, would they like to be referred to a provider to establish care? No .   Dental Screening: Recommended annual dental exams for proper oral hygiene  Community Resource Referral / Chronic Care Management: CRR required this visit?  No   CCM required this visit?  No      Plan:     I have personally reviewed and noted the following in the patient's chart:   Medical and social history Use of alcohol, tobacco or illicit drugs  Current medications and supplements including opioid prescriptions. Patient is not currently taking opioid prescriptions. Functional ability and status Nutritional status Physical activity Advanced directives List of other physicians Hospitalizations, surgeries, and ER visits in previous 12 months Vitals Screenings to include cognitive, depression, and falls Referrals and appointments  In addition, I have reviewed and discussed with patient certain preventive protocols, quality metrics, and best practice recommendations. A written personalized care plan for preventive services as well as general preventive health recommendations were provided to patient.     Maryan Puls, LPN   09/03/1094   Nurse Notes: None

## 2023-01-05 NOTE — Patient Instructions (Signed)
[name] , Thank you for taking time to come for your Medicare Wellness Visit. I appreciate your ongoing commitment to your health goals. Please review the following plan we discussed and let me know if I can assist you in the future.   These are the goals we discussed:  Goals       No current goals (pt-stated)      Patient Stated      Lose 10 pounds        This is a list of the screening recommended for you and due dates:  Health Maintenance  Topic Date Due   Zoster (Shingles) Vaccine (2 of 2) 01/04/2022   Flu Shot  03/30/2023   Medicare Annual Wellness Visit  01/05/2024   DTaP/Tdap/Td vaccine (2 - Td or Tdap) 09/25/2024   Colon Cancer Screening  10/09/2024   Mammogram  11/20/2024   Pneumonia Vaccine  Completed   DEXA scan (bone density measurement)  Completed   COVID-19 Vaccine  Completed   Hepatitis C Screening: USPSTF Recommendation to screen - Ages 28-79 yo.  Completed   HPV Vaccine  Aged Out    Advanced directives: on chart  Conditions/risks identified: none  Next appointment: Follow up in one year for your annual wellness visit 01/09/24 @ 9:30 telephone   Preventive Care 65 Years and Older, Female Preventive care refers to lifestyle choices and visits with your health care provider that can promote health and wellness. What does preventive care include? A yearly physical exam. This is also called an annual well check. Dental exams once or twice a year. Routine eye exams. Ask your health care provider how often you should have your eyes checked. Personal lifestyle choices, including: Daily care of your teeth and gums. Regular physical activity. Eating a healthy diet. Avoiding tobacco and drug use. Limiting alcohol use. Practicing safe sex. Taking low-dose aspirin every day. Taking vitamin and mineral supplements as recommended by your health care provider. What happens during an annual well check? The services and screenings done by your health care  provider during your annual well check will depend on your age, overall health, lifestyle risk factors, and family history of disease. Counseling  Your health care provider may ask you questions about your: Alcohol use. Tobacco use. Drug use. Emotional well-being. Home and relationship well-being. Sexual activity. Eating habits. History of falls. Memory and ability to understand (cognition). Work and work Astronomer. Reproductive health. Screening  You may have the following tests or measurements: Height, weight, and BMI. Blood pressure. Lipid and cholesterol levels. These may be checked every 5 years, or more frequently if you are over 55 years old. Skin check. Lung cancer screening. You may have this screening every year starting at age 56 if you have a 30-pack-year history of smoking and currently smoke or have quit within the past 15 years. Fecal occult blood test (FOBT) of the stool. You may have this test every year starting at age 50. Flexible sigmoidoscopy or colonoscopy. You may have a sigmoidoscopy every 5 years or a colonoscopy every 10 years starting at age 49. Hepatitis C blood test. Hepatitis B blood test. Sexually transmitted disease (STD) testing. Diabetes screening. This is done by checking your blood sugar (glucose) after you have not eaten for a while (fasting). You may have this done every 1-3 years. Bone density scan. This is done to screen for osteoporosis. You may have this done starting at age 36. Mammogram. This may be done every 1-2 years. Talk to your  health care provider about how often you should have regular mammograms. Talk with your health care provider about your test results, treatment options, and if necessary, the need for more tests. Vaccines  Your health care provider may recommend certain vaccines, such as: Influenza vaccine. This is recommended every year. Tetanus, diphtheria, and acellular pertussis (Tdap, Td) vaccine. You may need a Td  booster every 10 years. Zoster vaccine. You may need this after age 32. Pneumococcal 13-valent conjugate (PCV13) vaccine. One dose is recommended after age 38. Pneumococcal polysaccharide (PPSV23) vaccine. One dose is recommended after age 79. Talk to your health care provider about which screenings and vaccines you need and how often you need them. This information is not intended to replace advice given to you by your health care provider. Make sure you discuss any questions you have with your health care provider. Document Released: 09/11/2015 Document Revised: 05/04/2016 Document Reviewed: 06/16/2015 Elsevier Interactive Patient Education  2017 [place] Prevention in the Home Falls can cause injuries. They can happen to people of all ages. There are many things you can do to make your home safe and to help prevent falls. What can I do on the outside of my home? Regularly fix the edges of walkways and driveways and fix any cracks. Remove anything that might make you trip as you walk through a door, such as a raised step or threshold. Trim any bushes or trees on the path to your home. Use bright outdoor lighting. Clear any walking paths of anything that might make someone trip, such as rocks or tools. Regularly check to see if handrails are loose or broken. Make sure that both sides of any steps have handrails. Any raised decks and porches should have guardrails on the edges. Have any leaves, snow, or ice cleared regularly. Use sand or salt on walking paths during winter. Clean up any spills in your garage right away. This includes oil or grease spills. What can I do in the bathroom? Use night lights. Install grab bars by the toilet and in the tub and shower. Do not use towel bars as grab bars. Use non-skid mats or decals in the tub or shower. If you need to sit down in the shower, use a plastic, non-slip stool. Keep the floor dry. Clean up any water that spills on the floor  as soon as it happens. Remove soap buildup in the tub or shower regularly. Attach bath mats securely with double-sided non-slip rug tape. Do not have throw rugs and other things on the floor that can make you trip. What can I do in the bedroom? Use night lights. Make sure that you have a light by your bed that is easy to reach. Do not use any sheets or blankets that are too big for your bed. They should not hang down onto the floor. Have a firm chair that has side arms. You can use this for support while you get dressed. Do not have throw rugs and other things on the floor that can make you trip. What can I do in the kitchen? Clean up any spills right away. Avoid walking on wet floors. Keep items that you use a lot in easy-to-reach places. If you need to reach something above you, use a strong step stool that has a grab bar. Keep electrical cords out of the way. Do not use floor polish or wax that makes floors slippery. If you must use wax, use non-skid floor wax. Do not have  throw rugs and other things on the floor that can make you trip. What can I do with my stairs? Do not leave any items on the stairs. Make sure that there are handrails on both sides of the stairs and use them. Fix handrails that are broken or loose. Make sure that handrails are as long as the stairways. Check any carpeting to make sure that it is firmly attached to the stairs. Fix any carpet that is loose or worn. Avoid having throw rugs at the top or bottom of the stairs. If you do have throw rugs, attach them to the floor with carpet tape. Make sure that you have a light switch at the top of the stairs and the bottom of the stairs. If you do not have them, ask someone to add them for you. What else can I do to help prevent falls? Wear shoes that: Do not have high heels. Have rubber bottoms. Are comfortable and fit you well. Are closed at the toe. Do not wear sandals. If you use a stepladder: Make sure that it is  fully opened. Do not climb a closed stepladder. Make sure that both sides of the stepladder are locked into place. Ask someone to hold it for you, if possible. Clearly mark and make sure that you can see: Any grab bars or handrails. First and last steps. Where the edge of each step is. Use tools that help you move around (mobility aids) if they are needed. These include: Canes. Walkers. Scooters. Crutches. Turn on the lights when you go into a dark area. Replace any light bulbs as soon as they burn out. Set up your furniture so you have a clear path. Avoid moving your furniture around. If any of your floors are uneven, fix them. If there are any pets around you, be aware of where they are. Review your medicines with your doctor. Some medicines can make you feel dizzy. This can increase your chance of falling. Ask your doctor what other things that you can do to help prevent falls. This information is not intended to replace advice given to you by your health care provider. Make sure you discuss any questions you have with your health care provider. Document Released: 06/11/2009 Document Revised: 01/21/2016 Document Reviewed: 09/19/2014 Elsevier Interactive Patient Education  2017 [employer].

## 2023-03-23 DIAGNOSIS — Z86010 Personal history of colonic polyps: Secondary | ICD-10-CM | POA: Diagnosis not present

## 2023-03-23 DIAGNOSIS — K5909 Other constipation: Secondary | ICD-10-CM | POA: Diagnosis not present

## 2023-05-15 DIAGNOSIS — Z23 Encounter for immunization: Secondary | ICD-10-CM | POA: Diagnosis not present

## 2023-07-07 DIAGNOSIS — D2261 Melanocytic nevi of right upper limb, including shoulder: Secondary | ICD-10-CM | POA: Diagnosis not present

## 2023-07-07 DIAGNOSIS — D2262 Melanocytic nevi of left upper limb, including shoulder: Secondary | ICD-10-CM | POA: Diagnosis not present

## 2023-07-07 DIAGNOSIS — L570 Actinic keratosis: Secondary | ICD-10-CM | POA: Diagnosis not present

## 2023-07-07 DIAGNOSIS — D2272 Melanocytic nevi of left lower limb, including hip: Secondary | ICD-10-CM | POA: Diagnosis not present

## 2023-07-07 DIAGNOSIS — L821 Other seborrheic keratosis: Secondary | ICD-10-CM | POA: Diagnosis not present

## 2023-07-07 DIAGNOSIS — D225 Melanocytic nevi of trunk: Secondary | ICD-10-CM | POA: Diagnosis not present

## 2023-07-07 DIAGNOSIS — R238 Other skin changes: Secondary | ICD-10-CM | POA: Diagnosis not present

## 2023-07-07 DIAGNOSIS — B078 Other viral warts: Secondary | ICD-10-CM | POA: Diagnosis not present

## 2023-07-07 DIAGNOSIS — D2271 Melanocytic nevi of right lower limb, including hip: Secondary | ICD-10-CM | POA: Diagnosis not present

## 2023-07-07 DIAGNOSIS — Z85828 Personal history of other malignant neoplasm of skin: Secondary | ICD-10-CM | POA: Diagnosis not present

## 2023-07-14 ENCOUNTER — Ambulatory Visit: Payer: Medicare Other

## 2023-07-14 DIAGNOSIS — Z09 Encounter for follow-up examination after completed treatment for conditions other than malignant neoplasm: Secondary | ICD-10-CM | POA: Diagnosis not present

## 2023-07-14 DIAGNOSIS — Z860100 Personal history of colon polyps, unspecified: Secondary | ICD-10-CM | POA: Diagnosis not present

## 2023-07-14 DIAGNOSIS — K635 Polyp of colon: Secondary | ICD-10-CM | POA: Diagnosis not present

## 2023-07-14 DIAGNOSIS — D120 Benign neoplasm of cecum: Secondary | ICD-10-CM | POA: Diagnosis not present

## 2023-07-14 DIAGNOSIS — Z860101 Personal history of adenomatous and serrated colon polyps: Secondary | ICD-10-CM | POA: Diagnosis not present

## 2023-08-03 ENCOUNTER — Encounter: Payer: Self-pay | Admitting: Nurse Practitioner

## 2023-08-03 ENCOUNTER — Ambulatory Visit (INDEPENDENT_AMBULATORY_CARE_PROVIDER_SITE_OTHER): Payer: Medicare Other | Admitting: Nurse Practitioner

## 2023-08-03 VITALS — BP 130/82 | HR 73 | Temp 98.1°F | Ht 64.0 in | Wt 142.2 lb

## 2023-08-03 DIAGNOSIS — R350 Frequency of micturition: Secondary | ICD-10-CM

## 2023-08-03 DIAGNOSIS — N3091 Cystitis, unspecified with hematuria: Secondary | ICD-10-CM | POA: Diagnosis not present

## 2023-08-03 LAB — POCT URINALYSIS DIPSTICK
Bilirubin, UA: NEGATIVE
Blood, UA: POSITIVE
Glucose, UA: NEGATIVE
Ketones, UA: NEGATIVE
Nitrite, UA: NEGATIVE
Protein, UA: NEGATIVE
Spec Grav, UA: 1.01 (ref 1.010–1.025)
Urobilinogen, UA: 0.2 U/dL
pH, UA: 7.5 (ref 5.0–8.0)

## 2023-08-03 MED ORDER — CEPHALEXIN 500 MG PO CAPS: 500.0000 mg | ORAL_CAPSULE | Freq: Two times a day (BID) | ORAL | 0 refills | Status: DC

## 2023-08-03 NOTE — Assessment & Plan Note (Signed)
UA positive.  Will treat patient with Keflex 500 mg twice daily for 7 days.  Pending urine culture follow-up if no improvement

## 2023-08-03 NOTE — Progress Notes (Signed)
Acute Office Visit  Subjective:     Patient ID: Caitlyn Martinez, female    DOB: Feb 16, 1951, 72 y.o.   MRN: [id number]  Chief Complaint  Patient presents with   Urinary Tract Infection    Pt complains of burning and frequency when urinating. States this started Saturday. She it happens more at night. Feels pressure in vaginal area. When coughing she feels pressure in vaginal area as well. The Frequency is every hour. Straining.      Patient is in today for urinary symptoms with a history of osteoporosis, OA  States that it started Friday night or Saturday. She was at the beach with [place]. State that she felt a pressure that she had to pee and some pain (described as discomfort) that was all the time. States that she does not empty all the way and having some frequency.   Review of Systems  Constitutional:  Negative for chills and fever.  Respiratory:  Negative for shortness of breath.   Cardiovascular:  Negative for chest pain.  Gastrointestinal:  Positive for abdominal pain. Negative for constipation, diarrhea, nausea and vomiting.       BM was this  morning   Musculoskeletal:  Negative for back pain.        Objective:    BP 130/82   Pulse 73   Temp 98.1 F (36.7 C) (Oral)   Ht 5\' 4"  (1.626 m)   Wt 142 lb 3.2 oz (64.5 kg)   SpO2 98%   BMI 24.41 kg/m  BP Readings from Last 3 Encounters:  08/03/23 130/82  09/08/22 112/62  08/03/22 120/82   Wt Readings from Last 3 Encounters:  08/03/23 142 lb 3.2 oz (64.5 kg)  01/05/23 146 lb (66.2 kg)  09/08/22 146 lb (66.2 kg)   SpO2 Readings from Last 3 Encounters:  08/03/23 98%  09/08/22 95%  08/03/22 98%      Physical Exam Vitals and nursing note reviewed.  Constitutional:      Appearance: Normal appearance.  Cardiovascular:     Rate and Rhythm: Normal rate and regular rhythm.     Heart sounds: Normal heart sounds.  Pulmonary:     Effort: Pulmonary effort is normal.     Breath sounds: Normal breath sounds.   Abdominal:     General: Bowel sounds are normal. There is no distension.     Palpations: There is no mass.     Tenderness: There is no abdominal tenderness. There is no right CVA tenderness or left CVA tenderness.     Hernia: No hernia is present.  Neurological:     Mental Status: She is alert.     Results for orders placed or performed in visit on 08/03/23  POCT urinalysis dipstick  Result Value Ref Range   Color, UA dark yellow    Clarity, UA cloudy    Glucose, UA Negative Negative   Bilirubin, UA neg    Ketones, UA neg    Spec Grav, UA 1.010 1.010 - 1.025   Blood, UA pos    pH, UA 7.5 5.0 - 8.0   Protein, UA Negative Negative   Urobilinogen, UA 0.2 0.2 or 1.0 E.U./dL   Nitrite, UA neg    Leukocytes, UA Small (1+) (A) Negative   Appearance     Odor          Assessment & Plan:   Problem List Items Addressed This Visit       Genitourinary   Cystitis with hematuria  UA positive.  Will treat patient with Keflex 500 mg twice daily for 7 days.  Pending urine culture follow-up if no improvement      Relevant Medications   cephALEXin (KEFLEX) 500 MG capsule   Other Relevant Orders   Urine Culture     Other   Urinary frequency - Primary    UA in office.      Relevant Orders   POCT urinalysis dipstick (Completed)    Meds ordered this encounter  Medications   cephALEXin (KEFLEX) 500 MG capsule    Sig: Take 1 capsule (500 mg total) by mouth 2 (two) times daily.    Dispense:  14 capsule    Refill:  0    Order Specific Question:   Supervising Provider    Answer:   Roxy Manns A [[id number]]    Return if symptoms worsen or fail to improve, for As scheduled .  Audria Nine, NP

## 2023-08-03 NOTE — Patient Instructions (Signed)
Nice to see you today I will be in touch with the urine culture once I have them Follow up with me as scheduled or if you do not improve

## 2023-08-03 NOTE — Assessment & Plan Note (Signed)
UA in office 

## 2023-08-05 LAB — URINE CULTURE
MICRO NUMBER:: 15813678
SPECIMEN QUALITY:: ADEQUATE

## 2023-09-11 ENCOUNTER — Ambulatory Visit (INDEPENDENT_AMBULATORY_CARE_PROVIDER_SITE_OTHER): Payer: Medicare Other | Admitting: Nurse Practitioner

## 2023-09-11 ENCOUNTER — Encounter: Payer: Self-pay | Admitting: Nurse Practitioner

## 2023-09-11 VITALS — BP 138/86 | HR 75 | Temp 98.8°F | Ht 64.0 in | Wt 145.0 lb

## 2023-09-11 DIAGNOSIS — Z13220 Encounter for screening for lipoid disorders: Secondary | ICD-10-CM | POA: Diagnosis not present

## 2023-09-11 DIAGNOSIS — K5909 Other constipation: Secondary | ICD-10-CM | POA: Diagnosis not present

## 2023-09-11 DIAGNOSIS — E663 Overweight: Secondary | ICD-10-CM | POA: Diagnosis not present

## 2023-09-11 DIAGNOSIS — M810 Age-related osteoporosis without current pathological fracture: Secondary | ICD-10-CM

## 2023-09-11 DIAGNOSIS — Z1231 Encounter for screening mammogram for malignant neoplasm of breast: Secondary | ICD-10-CM

## 2023-09-11 LAB — COMPREHENSIVE METABOLIC PANEL
ALT: 14 U/L (ref 0–35)
AST: 19 U/L (ref 0–37)
Albumin: 4.3 g/dL (ref 3.5–5.2)
Alkaline Phosphatase: 90 U/L (ref 39–117)
BUN: 16 mg/dL (ref 6–23)
CO2: 29 meq/L (ref 19–32)
Calcium: 9.1 mg/dL (ref 8.4–10.5)
Chloride: 101 meq/L (ref 96–112)
Creatinine, Ser: 0.7 mg/dL (ref 0.40–1.20)
GFR: 86.95 mL/min (ref >60.00)
Glucose, Bld: 92 mg/dL (ref 70–99)
Potassium: 4.6 meq/L (ref 3.5–5.1)
Sodium: 137 meq/L (ref 135–145)
Total Bilirubin: 0.4 mg/dL (ref 0.2–1.2)
Total Protein: 6.7 g/dL (ref 6.0–8.3)

## 2023-09-11 LAB — CBC
HCT: 39.6 % (ref 36.0–46.0)
Hemoglobin: 13 g/dL (ref 12.0–15.0)
MCHC: 32.8 g/dL (ref 30.0–36.0)
MCV: 96.6 fL (ref 78.0–100.0)
Platelets: 256 10*3/uL (ref 150.0–400.0)
RBC: 4.1 Mil/uL (ref 3.87–5.11)
RDW: 12.7 % (ref 11.5–15.5)
WBC: 5.9 10*3/uL (ref 4.0–10.5)

## 2023-09-11 LAB — TSH: TSH: 1.81 u[IU]/mL (ref 0.35–5.50)

## 2023-09-11 LAB — LIPID PANEL
Cholesterol: 161 mg/dL (ref 0–200)
HDL: 60.5 mg/dL (ref >39.00)
LDL Cholesterol: 83 mg/dL (ref 0–99)
NonHDL: 100.84
Total CHOL/HDL Ratio: 3
Triglycerides: 88 mg/dL (ref 0.0–149.0)
VLDL: 17.6 mg/dL (ref 0.0–40.0)

## 2023-09-11 MED ORDER — LINACLOTIDE 72 MCG PO CAPS: 72.0000 ug | ORAL_CAPSULE | Freq: Every day | ORAL | 0 refills | Status: DC

## 2023-09-11 NOTE — Assessment & Plan Note (Signed)
 Pending TSH, A1c, lipid panel.  Continue working healthy lifestyle modifications

## 2023-09-11 NOTE — Patient Instructions (Signed)
 Nice to see you today I will be in touch with the labs once I have reviewed them Follow up with me in 1 year, sooner if you need me   Let me know if the bowel medication is affordable

## 2023-09-11 NOTE — Assessment & Plan Note (Signed)
 We will continue off of alendronate for now pending bone density scan in 2 months at that juncture evaluate results and consider bisphosphonate or Prolia

## 2023-09-11 NOTE — Assessment & Plan Note (Signed)
 Patient did take MiraLAX up to 3 times a day as needed can take this every day.  She doing a gentle stimulant laxative currently like to get her off.  Has tried Linzess  and Amitiza  in the past that affordable for patient leave insurance she will try with Linzess .

## 2023-09-11 NOTE — Progress Notes (Signed)
 Established Patient Office Visit  Subjective   Patient ID: Caitlyn Martinez, female    DOB: 1951/05/22  Age: 73 y.o. MRN: [id number]  Chief Complaint  Patient presents with   Annual Exam    HPI  for complete physical and follow up of chronic conditions.   Osteoporosis: states that she has been off the medication. States that she has been off it for about a year.   Constipation: states that she takes a gentle laxative and it is a stimulant. States that she has tried miralax and stool softner without relief   Immunizations: -Tetanus: Completed in 2016 -Influenza:  09/16/20204 -Shingles: Completed Shingrix  -Pneumonia: Completed   Diet: Fair diet. She is eating 3 meals a day. Sometimes she will snack she will do coffee int he am, sweet tea, water.  Exercise:  Walks daily and it will vary. States that she does have a treadmill if it is cold  10000 steps   Eye exam: Completes annually. reading  Dental exam: Completes semi-annually    Colonoscopy: Completed in 07/20/2023  Lung Cancer Screening: NA  Pap smear: aged out  DEXA: 11/17/2021  Mammogram: 11/22/2022  Sleep: she will go to bed around 11 and get up around 6-7. Feels rested. Does not snore   Advanced directive: she is unsure         Review of Systems  Constitutional:  Negative for chills and fever.  Respiratory:  Negative for shortness of breath.   Cardiovascular:  Negative for chest pain and leg swelling.  Gastrointestinal:  Positive for constipation. Negative for abdominal pain, blood in stool, diarrhea, nausea and vomiting.       BM daily   Genitourinary:  Negative for dysuria and hematuria.  Neurological:  Negative for tingling and headaches.  Psychiatric/Behavioral:  Negative for hallucinations and suicidal ideas.       Objective:     BP 138/86   Pulse 75   Temp 98.8 F (37.1 C) (Oral)   Ht 5' 4 (1.626 m)   Wt 145 lb (65.8 kg)   SpO2 96%   BMI 24.89 kg/m  BP Readings from Last 3  Encounters:  09/11/23 138/86  08/03/23 130/82  09/08/22 112/62   Wt Readings from Last 3 Encounters:  09/11/23 145 lb (65.8 kg)  08/03/23 142 lb 3.2 oz (64.5 kg)  01/05/23 146 lb (66.2 kg)   SpO2 Readings from Last 3 Encounters:  09/11/23 96%  08/03/23 98%  09/08/22 95%      Physical Exam Vitals and nursing note reviewed.  Constitutional:      Appearance: Normal appearance.  HENT:     Right Ear: Tympanic membrane, ear canal and external ear normal.     Left Ear: Tympanic membrane, ear canal and external ear normal.     Mouth/Throat:     Mouth: Mucous membranes are moist.     Pharynx: Oropharynx is clear.  Eyes:     Extraocular Movements: Extraocular movements intact.     Pupils: Pupils are equal, round, and reactive to light.  Cardiovascular:     Rate and Rhythm: Normal rate and regular rhythm.     Pulses: Normal pulses.     Heart sounds: Normal heart sounds.  Pulmonary:     Effort: Pulmonary effort is normal.     Breath sounds: Normal breath sounds.  Abdominal:     General: Bowel sounds are normal. There is no distension.     Palpations: There is no mass.  Tenderness: There is no abdominal tenderness.     Hernia: No hernia is present.  Musculoskeletal:     Right lower leg: No edema.     Left lower leg: No edema.  Lymphadenopathy:     Cervical: No cervical adenopathy.  Skin:    General: Skin is warm.  Neurological:     General: No focal deficit present.     Mental Status: She is alert.     Deep Tendon Reflexes:     Reflex Scores:      Bicep reflexes are 2+ on the right side and 2+ on the left side.      Patellar reflexes are 2+ on the right side and 2+ on the left side.    Comments: Bilateral upper and lower extremity strength 5/5  Psychiatric:        Mood and Affect: Mood normal.        Behavior: Behavior normal.        Thought Content: Thought content normal.        Judgment: Judgment normal.      No results found for any visits on  09/11/23.    The 10-year ASCVD risk score (Arnett DK, et al., 2019) is: 11.4%    Assessment & Plan:   Problem List Items Addressed This Visit       Musculoskeletal and Integument   Age-related osteoporosis without current pathological fracture - Primary   We will continue off of alendronate  for now pending bone density scan in 2 months at that juncture evaluate results and consider bisphosphonate or Prolia      Relevant Orders   DG Bone Density   CBC   Comprehensive metabolic panel     Other   Other constipation   Patient did take MiraLAX up to 3 times a day as needed can take this every day.  She doing a gentle stimulant laxative currently like to get her off.  Has tried Linzess  and Amitiza  in the past that affordable for patient leave insurance she will try with Linzess .      Relevant Medications   linaclotide  (LINZESS ) 72 MCG capsule   Other Relevant Orders   CBC   Comprehensive metabolic panel   TSH   Overweight   Pending TSH, A1c, lipid panel.  Continue working healthy lifestyle modifications      Other Visit Diagnoses       Screening mammogram for breast cancer       Relevant Orders   MM 3D SCREENING MAMMOGRAM BILATERAL BREAST     Screening for lipid disorders       Relevant Orders   Lipid panel       Return in about 1 year (around 09/10/2024) for CPE and Labs.    Adina Crandall, NP

## 2023-11-23 ENCOUNTER — Ambulatory Visit
Admission: RE | Admit: 2023-11-23 | Discharge: 2023-11-23 | Disposition: A | Discharge status: Home / Self Care | Payer: Medicare Other | Source: Ambulatory Visit | Attending: Nurse Practitioner

## 2023-11-23 ENCOUNTER — Ambulatory Visit
Admission: RE | Admit: 2023-11-23 | Discharge: 2023-11-23 | Disposition: A | Discharge status: Home / Self Care | Payer: Medicare Other | Source: Ambulatory Visit | Attending: Nurse Practitioner | Admitting: Nurse Practitioner

## 2023-11-23 DIAGNOSIS — M810 Age-related osteoporosis without current pathological fracture: Secondary | ICD-10-CM | POA: Diagnosis not present

## 2023-11-23 DIAGNOSIS — M85832 Other specified disorders of bone density and structure, left forearm: Secondary | ICD-10-CM | POA: Diagnosis not present

## 2023-11-23 DIAGNOSIS — Z780 Asymptomatic menopausal state: Secondary | ICD-10-CM | POA: Diagnosis not present

## 2023-11-23 DIAGNOSIS — Z1231 Encounter for screening mammogram for malignant neoplasm of breast: Secondary | ICD-10-CM | POA: Insufficient documentation

## 2023-11-27 ENCOUNTER — Encounter: Payer: Self-pay | Admitting: Nurse Practitioner

## 2023-11-27 DIAGNOSIS — M810 Age-related osteoporosis without current pathological fracture: Secondary | ICD-10-CM

## 2023-11-28 ENCOUNTER — Encounter: Payer: Self-pay | Admitting: Nurse Practitioner

## 2023-11-29 MED ORDER — ALENDRONATE SODIUM 70 MG PO TABS: 70.0000 mg | ORAL_TABLET | ORAL | 11 refills | Status: AC

## 2023-11-29 NOTE — Addendum Note (Signed)
 Addended by: [name] on: 11/29/2023 08:32 AM   Modules accepted: Orders

## 2023-12-19 DIAGNOSIS — H40013 Open angle with borderline findings, low risk, bilateral: Secondary | ICD-10-CM | POA: Diagnosis not present

## 2023-12-22 DIAGNOSIS — Z85828 Personal history of other malignant neoplasm of skin: Secondary | ICD-10-CM | POA: Diagnosis not present

## 2023-12-22 DIAGNOSIS — D225 Melanocytic nevi of trunk: Secondary | ICD-10-CM | POA: Diagnosis not present

## 2023-12-22 DIAGNOSIS — L821 Other seborrheic keratosis: Secondary | ICD-10-CM | POA: Diagnosis not present

## 2023-12-22 DIAGNOSIS — L820 Inflamed seborrheic keratosis: Secondary | ICD-10-CM | POA: Diagnosis not present

## 2023-12-22 DIAGNOSIS — D485 Neoplasm of uncertain behavior of skin: Secondary | ICD-10-CM | POA: Diagnosis not present

## 2023-12-22 DIAGNOSIS — D2261 Melanocytic nevi of right upper limb, including shoulder: Secondary | ICD-10-CM | POA: Diagnosis not present

## 2023-12-22 DIAGNOSIS — Z08 Encounter for follow-up examination after completed treatment for malignant neoplasm: Secondary | ICD-10-CM | POA: Diagnosis not present

## 2023-12-22 DIAGNOSIS — D2272 Melanocytic nevi of left lower limb, including hip: Secondary | ICD-10-CM | POA: Diagnosis not present

## 2023-12-22 DIAGNOSIS — L570 Actinic keratosis: Secondary | ICD-10-CM | POA: Diagnosis not present

## 2023-12-22 DIAGNOSIS — D2271 Melanocytic nevi of right lower limb, including hip: Secondary | ICD-10-CM | POA: Diagnosis not present

## 2023-12-22 DIAGNOSIS — D2262 Melanocytic nevi of left upper limb, including shoulder: Secondary | ICD-10-CM | POA: Diagnosis not present

## 2024-01-09 ENCOUNTER — Ambulatory Visit (INDEPENDENT_AMBULATORY_CARE_PROVIDER_SITE_OTHER): Payer: Medicare Other

## 2024-01-09 VITALS — Ht 64.0 in | Wt 140.0 lb

## 2024-01-09 DIAGNOSIS — Z0000 Encounter for general adult medical examination without abnormal findings: Secondary | ICD-10-CM | POA: Diagnosis not present

## 2024-01-09 NOTE — Patient Instructions (Signed)
 Caitlyn Martinez , Thank you for taking time out of your busy schedule to complete your Annual Wellness Visit with me. I enjoyed our conversation and look forward to speaking with you again next year. I, as well as your care team,  appreciate your ongoing commitment to your health goals. Please review the following plan we discussed and let me know if I can assist you in the future. Your Game plan/ To Do List     Follow up Visits: Next Medicare AWV with our clinical staff: 01/09/25 @ 9:30am televisit   Have you seen your provider in the last 6 months (3 months if uncontrolled diabetes)? Yes Next Office Visit with your provider: 09/12/2024   Clinician Recommendations:  Aim for 30 minutes of exercise or brisk walking, 6-8 glasses of water, and 5 servings of fruits and vegetables each day.       This is a list of the screening recommended for you and due dates:  Health Maintenance  Topic Date Due   Zoster (Shingles) Vaccine (2 of 2) 01/04/2022   COVID-19 Vaccine (6 - 2024-25 season) 08/02/2024*   Flu Shot  03/29/2024   DTaP/Tdap/Td vaccine (2 - Td or Tdap) 09/25/2024   Medicare Annual Wellness Visit  01/08/2025   Mammogram  11/22/2025   Colon Cancer Screening  08/01/2033   Pneumonia Vaccine  Completed   DEXA scan (bone density measurement)  Completed   Hepatitis C Screening  Completed   HPV Vaccine  Aged Out   Meningitis B Vaccine  Aged Out  *Topic was postponed. The date shown is not the original due date.    Advanced directives: (In Chart) A copy of your advanced directives are scanned into your chart should your provider ever need it. Advance Care Planning is important because it:  [x]  Makes sure you receive the medical care that is consistent with your values, goals, and preferences  [x]  It provides guidance to your family and loved ones and reduces their decisional burden about whether or not they are making the right decisions based on your wishes.  Follow the link provided in your  after visit summary or read over the paperwork we have mailed to you to help you started getting your Advance Directives in place. If you need assistance in completing these, please reach out to us  so that we can help you!

## 2024-01-09 NOTE — Progress Notes (Signed)
 Subjective:   Caitlyn Martinez is a 73 y.o. who presents for a Medicare Wellness preventive visit.  As a reminder, Annual Wellness Visits don't include a physical exam, and some assessments may be limited, especially if this visit is performed virtually. We may recommend an in-person visit if needed.  Visit Complete: Virtual I connected with  [name] on 01/09/24 by a audio enabled telemedicine application and verified that I am speaking with the correct person using two identifiers.  Patient Location: Home  Provider Location: Office/Clinic  I discussed the limitations of evaluation and management by telemedicine. The patient expressed understanding and agreed to proceed.  Vital Signs: Because this visit was a virtual/telehealth visit, some criteria may be missing or patient reported. Any vitals not documented were not able to be obtained and vitals that have been documented are patient reported.  VideoDeclined- This patient declined Librarian, academic. Therefore the visit was completed with audio only.  Persons Participating in Visit: Patient.  AWV Questionnaire: No: Patient Medicare AWV questionnaire was not completed prior to this visit.  Cardiac Risk Factors include: advanced age (>52men, >24 women)     Objective:     Today's Vitals   01/09/24 0919  Weight: 140 lb (63.5 kg)  Height: 5\' 4"  (1.626 m)   Body mass index is 24.03 kg/m.     01/09/2024    9:28 AM 01/05/2023   10:41 AM 01/03/2022    3:41 PM  Advanced Directives  Does Patient Have a Medical Advance Directive? Yes Yes No  Type of Estate agent of [place];Living will Healthcare Power of [name];Living will   Does patient want to make changes to medical advance directive?  No - Patient declined   Copy of Healthcare Power of Attorney in Chart? Yes - validated most recent copy scanned in chart (See row information) Yes - validated most recent copy scanned in  chart (See row information)   Would patient like information on creating a medical advance directive?   No - Patient declined    Current Medications (verified) Outpatient Encounter Medications as of 01/09/2024  Medication Sig   alendronate  (FOSAMAX ) 70 MG tablet Take 1 tablet (70 mg total) by mouth every 7 (seven) days. Take with a full glass of water on an empty stomach.   CALCIUM-MAGNESIUM-ZINC PO Take 2 tablets by mouth daily. Calcium 1000 mg/magnesium 400 mg/zinc 15 mg   diphenhydrAMINE (BENADRYL ALLERGY) 25 mg capsule Take 25 mg by mouth at bedtime.   linaclotide  (LINZESS ) 72 MCG capsule Take 1 capsule (72 mcg total) by mouth daily before breakfast.   augmented betamethasone  dipropionate (DIPROLENE -AF) 0.05 % cream As needed (Patient not taking: Reported on 01/09/2024)   cephALEXin  (KEFLEX ) 500 MG capsule Take 1 capsule (500 mg total) by mouth 2 (two) times daily. (Patient not taking: Reported on 01/09/2024)   No facility-administered encounter medications on file as of 01/09/2024.    Allergies (verified) Patient has no known allergies.   History: Past Medical History:  Diagnosis Date   Arthritis    Chicken pox    Osteoporosis    Past Surgical History:  Procedure Laterality Date   RHINOPLASTY     TONSILLECTOMY AND ADENOIDECTOMY     TUBAL LIGATION  1987   Family History  Problem Relation Age of Onset   Cancer Mother        Breast   Breast cancer Mother 10   Diabetes Father    Alcohol abuse Father  Cancer Father        Lung and Prostate   Social History   Socioeconomic History   Marital status: Married    Spouse name: Eddie Good   Number of children: 2   Years of education: Not on file   Highest education level: Not on file  Occupational History   Not on file  Tobacco Use   Smoking status: Never   Smokeless tobacco: Never  Vaping Use   Vaping status: Never Used  Substance and Sexual Activity   Alcohol use: Not Currently    Comment: occasional beer   Drug use:  No   Sexual activity: Not on file  Other Topics Concern   Not on file  Social History Narrative   Caitlyn Martinez 46   Caitlyn Martinez 37      Hobbies: Sewing, walking    Social Drivers of Corporate investment banker Strain: Low Risk  (01/09/2024)   Overall Financial Resource Strain (CARDIA)    Difficulty of Paying Living Expenses: Not hard at all  Food Insecurity: No Food Insecurity (01/09/2024)   Hunger Vital Sign    Worried About Running Out of Food in the Last Year: Never true    Ran Out of Food in the Last Year: Never true  Transportation Needs: No Transportation Needs (01/09/2024)   PRAPARE - Administrator, Civil Service (Medical): No    Lack of Transportation (Non-Medical): No  Physical Activity: Sufficiently Active (01/09/2024)   Exercise Vital Sign    Days of Exercise per Week: 7 days    Minutes of Exercise per Session: 60 min  Stress: No Stress Concern Present (01/09/2024)   [employer] of Occupational Health - Occupational Stress Questionnaire    Feeling of Stress : Not at all  Social Connections: Moderately Integrated (01/09/2024)   Social Connection and Isolation Panel [NHANES]    Frequency of Communication with Friends and Family: More than three times a week    Frequency of Social Gatherings with Friends and Family: More than three times a week    Attends Religious Services: More than 4 times per year    Active Member of [employer] or Organizations: No    Attends Banker Meetings: Never    Marital Status: Married    Tobacco Counseling Counseling given: Not Answered    Clinical Intake:  Pre-visit preparation completed: Yes  Pain : No/denies pain     BMI - recorded: 24.03 Nutritional Status: BMI of 19-24  Normal Nutritional Risks: None Diabetes: No  Lab Results  Component Value Date   HGBA1C 5.7 (H) 09/12/2016   HGBA1C 5.9 (H) 09/11/2015     How often do you need to have someone help you when you read instructions, pamphlets, or other  written materials from your doctor or pharmacy?: 1 - Never  Interpreter Needed?: No  Comments: lives with husband Information entered by :: B.Alessandro Griep,LPN   Activities of Daily Living     01/09/2024    9:29 AM  In your present state of health, do you have any difficulty performing the following activities:  Hearing? 0  Vision? 0  Difficulty concentrating or making decisions? 0  Walking or climbing stairs? 0  Dressing or bathing? 0  Doing errands, shopping? 0  Preparing Food and eating ? N  Using the Toilet? N  In the past six months, have you accidently leaked urine? N  Do you have problems with loss of bowel control? N  Managing your Medications? N  Managing  your Finances? N  Housekeeping or managing your Housekeeping? N    Patient Care Team: Dorothe Gaster, NP as PCP - General (Pain Medicine) [facility])  Indicate any recent Medical Services you may have received from other than [facility] providers in the past year (date may be approximate).     Assessment:    This is a routine wellness examination for [facility].  Hearing/Vision screen Hearing Screening - Comments:: Pt says her hearing is good  Vision Screening - Comments:: Pt says her vision is good;readers only Dr Jorge Newcomer   Goals Addressed   None    Depression Screen     01/09/2024    9:26 AM 09/11/2023    8:20 AM 08/03/2023   10:46 AM 01/05/2023   10:40 AM 01/03/2022    3:37 PM 09/06/2021   11:06 AM 09/06/2021   11:05 AM  PHQ 2/9 Scores  PHQ - 2 Score 0 0 0 0 0 0 0  PHQ- 9 Score  0 1   2     Fall Risk     01/09/2024    9:22 AM 09/11/2023    8:20 AM 08/03/2023   10:47 AM 01/05/2023   10:42 AM 01/03/2022    3:39 PM  Fall Risk   Falls in the past year? 0 0 0 1 0  Number falls in past yr: 0 0 0 1 0  Comment    slipped on wet floor   Injury with Fall? 0 0 0 0 0  Risk for fall due to : No Fall Risks No Fall Risks No Fall Risks No Fall Risks No Fall Risks  Follow up Education provided;Falls prevention  discussed Falls evaluation completed Falls evaluation completed Falls prevention discussed;Falls evaluation completed     MEDICARE RISK AT HOME:  Medicare Risk at Home Any stairs in or around the home?: No If so, are there any without handrails?: No Home free of loose throw rugs in walkways, pet beds, electrical cords, etc?: Yes Adequate lighting in your home to reduce risk of falls?: Yes Life alert?: No Use of a cane, walker or w/c?: No Grab bars in the bathroom?: Yes Shower chair or bench in shower?: No Elevated toilet seat or a handicapped toilet?: Yes  TIMED UP AND GO:  Was the test performed?  No  Cognitive Function: 6CIT completed        01/09/2024    9:29 AM 01/05/2023   10:43 AM 01/03/2022    3:42 PM  6CIT Screen  What Year? 0 points 0 points 0 points  What month? 0 points 0 points 0 points  What time? 0 points 0 points 0 points  Count back from 20 0 points 0 points 0 points  Months in reverse 0 points 0 points 0 points  Repeat phrase 0 points 0 points 0 points  Total Score 0 points 0 points 0 points    Immunizations Immunization History  Administered Date(s) Administered   Influenza, High Dose Seasonal PF 06/01/2020, 06/14/2021   Influenza,inj,Quad PF,6+ Mos 09/04/2014, 09/11/2015, 10/03/2016, 09/11/2017, 05/30/2018, 05/31/2019   Influenza-Unspecified 06/16/2022, 05/15/2023   Moderna SARS-COV2 Booster Vaccination 07/13/2021   Moderna Sars-Covid-2 Vaccination 10/03/2019, 10/31/2019, 06/29/2020   PNEUMOCOCCAL CONJUGATE-20 06/14/2021   Pfizer(Comirnaty)Fall Seasonal Vaccine 12 years and older 06/16/2022   Pneumococcal Conjugate-13 09/11/2017   Pneumococcal Polysaccharide-23 09/17/2018   Pneumococcal-Unspecified 06/14/2021   Tdap 09/25/2014   Zoster Recombinant(Shingrix) 11/09/2021   Zoster, Live 09/11/2014    Screening Tests Health Maintenance  Topic Date Due  Zoster Vaccines- Shingrix (2 of 2) 01/04/2022   COVID-19 Vaccine (6 - 2024-25 season) 08/02/2024  (Originally 04/30/2023)   INFLUENZA VACCINE  03/29/2024   DTaP/Tdap/Td (2 - Td or Tdap) 09/25/2024   Medicare Annual Wellness (AWV)  01/08/2025   MAMMOGRAM  11/22/2025   Colonoscopy  08/01/2033   Pneumonia Vaccine 62+ Years old  Completed   DEXA SCAN  Completed   Hepatitis C Screening  Completed   HPV VACCINES  Aged Out   Meningococcal B Vaccine  Aged Out    Health Maintenance  Health Maintenance Due  Topic Date Due   Zoster Vaccines- Shingrix (2 of 2) 01/04/2022   Health Maintenance Items Addressed: None at this time needed  Additional Screening:  Vision Screening: Recommended annual ophthalmology exams for early detection of glaucoma and other disorders of the eye.  Dental Screening: Recommended annual dental exams for proper oral hygiene  Community Resource Referral / Chronic Care Management: CRR required this visit?  No   CCM required this visit?  No   Plan:    I have personally reviewed and noted the following in the patient's chart:   Medical and social history Use of alcohol, tobacco or illicit drugs  Current medications and supplements including opioid prescriptions. Patient is not currently taking opioid prescriptions. Functional ability and status Nutritional status Physical activity Advanced directives List of other physicians Hospitalizations, surgeries, and ER visits in previous 12 months Vitals Screenings to include cognitive, depression, and falls Referrals and appointments  In addition, I have reviewed and discussed with patient certain preventive protocols, quality metrics, and best practice recommendations. A written personalized care plan for preventive services as well as general preventive health recommendations were provided to patient.  Nerissa Bannister, LPN   01/31/5408   After Visit Summary: (MyChart) Due to this being a telephonic visit, the after visit summary with patients personalized plan was offered to patient via MyChart   Notes:  Nothing significant to report at this time.

## 2024-04-10 ENCOUNTER — Ambulatory Visit: Payer: Self-pay

## 2024-04-10 NOTE — Telephone Encounter (Signed)
 Noted

## 2024-04-10 NOTE — Telephone Encounter (Signed)
 FYI Only or Action Required?: FYI only for provider.  Patient was last seen in primary care on 09/11/2023 by Wendee Lynwood HERO, NP.  Called Nurse Triage reporting Shoulder Pain.  Symptoms began a week ago.  Interventions attempted: Nothing.  Symptoms are: unchanged.  Triage Disposition: See HCP Within 4 Hours (Or PCP Triage) Apt made for Monday due to patient being out of town and coming home on weekend.  Patient/caregiver understands and will follow disposition?: Yes    Copied from CRM [id number]. Topic: Clinical - Red Word Triage >> Apr 10, 2024 11:11 AM Lavanda D wrote: Red Word that prompted transfer to Nurse Triage: Patient feeling a lot of pain across shoulders, she has not fallen/had any injuries. Patient said in the mornings it would be a 8/10, during the day it gets a bit better. If she takes 3 ibuprofen before bed it helps with the pain in the morning.   ----------------------------------------------------------------------- From previous Reason for Contact - Scheduling: Patient/patient representative is calling to schedule an appointment. Refer to attachments for appointment information. Reason for Disposition  [1] Shoulder pains with exertion (e.g., walking) AND [2] pain goes away on resting AND [3] not present now  Answer Assessment - Initial Assessment Questions 1. ONSET: When did the pain start?     Beginning of the summer 2. LOCATION: Where is the pain located?     Both shoulders 3. PAIN: How bad is the pain? (Scale 1-10; or mild, moderate, severe)     moderate 4. WORK OR EXERCISE: Has there been any recent work or exercise that involved this part of the body?     denies 5. CAUSE: What do you think is causing the shoulder pain?     unknown 6. OTHER SYMPTOMS: Do you have any other symptoms? (e.g., neck pain, swelling, rash, fever, numbness, weakness)     no 7. PREGNANCY: Is there any chance you are pregnant? When was your last menstrual period?      na  Protocols used: Shoulder Pain-A-AH

## 2024-04-15 ENCOUNTER — Ambulatory Visit (INDEPENDENT_AMBULATORY_CARE_PROVIDER_SITE_OTHER): Admitting: Family Medicine

## 2024-04-15 ENCOUNTER — Ambulatory Visit: Payer: Self-pay | Admitting: Family Medicine

## 2024-04-15 ENCOUNTER — Ambulatory Visit (INDEPENDENT_AMBULATORY_CARE_PROVIDER_SITE_OTHER)
Admission: RE | Admit: 2024-04-15 | Discharge: 2024-04-15 | Disposition: A | Discharge status: Home / Self Care | Source: Ambulatory Visit | Attending: Family Medicine | Admitting: Family Medicine

## 2024-04-15 ENCOUNTER — Encounter: Payer: Self-pay | Admitting: Family Medicine

## 2024-04-15 VITALS — BP 128/70 | HR 79 | Temp 98.4°F | Ht 64.0 in | Wt 139.0 lb

## 2024-04-15 DIAGNOSIS — G8929 Other chronic pain: Secondary | ICD-10-CM | POA: Diagnosis not present

## 2024-04-15 DIAGNOSIS — M25511 Pain in right shoulder: Secondary | ICD-10-CM | POA: Diagnosis not present

## 2024-04-15 DIAGNOSIS — M25512 Pain in left shoulder: Secondary | ICD-10-CM | POA: Insufficient documentation

## 2024-04-15 DIAGNOSIS — M19011 Primary osteoarthritis, right shoulder: Secondary | ICD-10-CM | POA: Diagnosis not present

## 2024-04-15 DIAGNOSIS — M19012 Primary osteoarthritis, left shoulder: Secondary | ICD-10-CM | POA: Diagnosis not present

## 2024-04-15 NOTE — Telephone Encounter (Signed)
 Pt called and schedule appt

## 2024-04-15 NOTE — Patient Instructions (Addendum)
 Xrays today   In the morning try Warm compress Cool compress  See what works better   We will make a plan

## 2024-04-15 NOTE — Telephone Encounter (Signed)
 Pt called and schedule appt with [place]

## 2024-04-15 NOTE — Progress Notes (Signed)
 Subjective:    Patient ID: Caitlyn Martinez, female    DOB: February 15, 1951, 73 y.o.   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   Both shoulders Started at the beginning of the summer   Worst in the mornings  Exacerbated by abducting arms /lifting arms up over head  After a few hours is starts to improve   Sharp pain at first, turns dull before end of the day  Feeling of stiffness also  On the top of shoulder  No tenderness No swelling No redness No old shoulder injuries No trauma  No history of auto immune dz   Takes fosamax      Has OA in hands- stiff but not bothersome   Relieved by not using them / being still    Can manage with aleve- if she takes it at night will not feel in ams   Very active  Walks a lot  Does water exercise   As a rule/no work above her head   DG Shoulder Right Result Date: 04/15/2024 CLINICAL DATA:  Chronic bilateral shoulder pain. EXAM: RIGHT SHOULDER - 2+ VIEW COMPARISON:  None Available. FINDINGS: No acute findings. Trace degenerative changes in the right acromioclavicular joint. Visualized portion of the right chest post apical pleural thickening. IMPRESSION: Trace right acromioclavicular joint osteoarthritis. Electronically Signed   By: Newell Eke M.D.   On: 04/15/2024 08:48   DG Shoulder Left Result Date: 04/15/2024 CLINICAL DATA:  Chronic bilateral shoulder pain. EXAM: LEFT SHOULDER - 2+ VIEW COMPARISON:  None Available. FINDINGS: Mild left acromioclavicular joint osteoarthritis. Left shoulder is otherwise unremarkable. Visualized left chest is grossly unremarkable. IMPRESSION: Mild left acromioclavicular joint osteoarthritis. Electronically Signed   By:  Newell Eke M.D.   On: 04/15/2024 08:48      Patient Active Problem List   Diagnosis Date Noted   Bilateral shoulder pain 04/15/2024   Urinary frequency 08/03/2023   Cystitis with hematuria 08/03/2023   Overweight 09/08/2022   Decreased hearing 09/08/2022   Other constipation 08/03/2022   Establishing care with new doctor, encounter for 09/06/2021   Encounter for screening mammogram for malignant neoplasm of breast 09/06/2021   Elevated BP without diagnosis of hypertension 09/06/2021   Age-related osteoporosis without current pathological fracture 09/17/2018   Past Medical History:  Diagnosis Date   Arthritis    Chicken pox    Osteoporosis    Past Surgical History:  Procedure Laterality Date   RHINOPLASTY     TONSILLECTOMY AND ADENOIDECTOMY     TUBAL LIGATION  1987   Social History   Tobacco Use   Smoking status: Never   Smokeless tobacco: Never  Vaping Use   Vaping status: Never Used  Substance Use Topics   Alcohol use: Not Currently    Comment: occasional beer   Drug use: No   Family History  Problem Relation Age of Onset   Cancer Mother        Breast   Breast cancer Mother 52   Diabetes Father    Alcohol abuse Father    Cancer Father  Lung and Prostate   No Known Allergies Current Outpatient Medications on File Prior to Visit  Medication Sig Dispense Refill   alendronate  (FOSAMAX ) 70 MG tablet Take 1 tablet (70 mg total) by mouth every 7 (seven) days. Take with a full glass of water on an empty stomach. 4 tablet 11   augmented betamethasone  dipropionate (DIPROLENE -AF) 0.05 % cream As needed     Bisacodyl (LAXATIVE PO) Take 1 tablet by mouth daily.     diphenhydrAMINE (BENADRYL ALLERGY) 25 mg capsule Take 25 mg by mouth at bedtime.     No current facility-administered medications on file prior to visit.    Review of Systems  Constitutional:  Negative for fatigue and fever.  Musculoskeletal:  Positive for arthralgias. Negative for back pain,  gait problem, joint swelling, myalgias and neck pain.       Shoulder pain   Skin:  Negative for rash.       Objective:   Physical Exam Constitutional:      General: She is not in acute distress.    Appearance: Normal appearance. She is normal weight. She is not ill-appearing.  Cardiovascular:     Rate and Rhythm: Normal rate and regular rhythm.  Musculoskeletal:     Right shoulder: Tenderness present. No swelling, deformity, effusion or crepitus. Normal range of motion. Normal strength. Normal pulse.     Left shoulder: Tenderness present. No swelling, deformity, effusion or crepitus. Normal range of motion. Normal strength. Normal pulse.     Comments: Neer test positive- worse on right  Hawking =equivocal Some mild acromion tenderness bilat  Apprehension to extend= bilat   No trap/ deltoid or upper back tenderness      Skin:    General: Skin is warm and dry.     Findings: No erythema or rash.     Comments: tanned  Neurological:     Mental Status: She is alert.     Sensory: No sensory deficit.     Motor: No weakness.           Assessment & Plan:   Problem List Items Addressed This Visit       Other   Bilateral shoulder pain - Primary   73 yo active female  Sharp and dull pain that she wakes up with- gets better after 2 h of movement  Anterior/lateral  No muscular or shoulder girdle pain  Aleve over the counter is helpful Worse with Neer test and also internal rotation , some acromion tenderness Fair rom Has mod to severe OA in hands   Xray today -mild AC deg changes bilat / worse on left  Encouraged trial of cold and warm to see what helps more   Suggest sport med visit  Suspect some non arthritis strain/ tendonitis in addn to mild arthritis          Relevant Orders   DG Shoulder Left (Completed)   DG Shoulder Right (Completed)

## 2024-04-15 NOTE — Assessment & Plan Note (Addendum)
 73 yo active female  Sharp and dull pain that she wakes up with- gets better after 2 h of movement  Anterior/lateral  No muscular or shoulder girdle pain  Aleve over the counter is helpful Worse with Neer test and also internal rotation , some acromion tenderness Fair rom Has mod to severe OA in hands   Xray today -mild AC deg changes bilat / worse on left  Encouraged trial of cold and warm to see what helps more   Suggest sport med visit  Suspect some non arthritis strain/ tendonitis in addn to mild arthritis

## 2024-04-16 NOTE — Progress Notes (Signed)
     Cherokee Clowers T. Terressa Evola, MD, CAQ Sports Medicine [facility] [address] [place], [address]  Phone: [phone]  FAX: [phone]  Caitlyn Martinez - 73 y.o. female  MRN [id number]  Date of Birth: 13-Jul-1951  Date: 04/17/2024  PCP: Wendee Lynwood HERO, NP  Referral: Wendee Lynwood HERO, NP  No chief complaint on file.  Subjective:   Caitlyn Martinez is a 73 y.o. very pleasant female patient with There is no height or weight on file to calculate BMI. who presents with the following:  Discussed the use of AI scribe software for clinical note transcription with the patient, who gave verbal consent to proceed.  Patient presents courtesy of referral from Dr. Randeen.  She was seen 2 days ago for chronic bilateral shoulder pain.  Bilateral shoulder films were independently reviewed by myself.  The patient has preserved glenohumeral joints bilaterally with some mild acromioclavicular joint arthritis.  History of Present Illness      Review of Systems is noted in the HPI, as appropriate  Objective:   There were no vitals taken for this visit.  GEN: No acute distress; alert,appropriate. PULM: Breathing comfortably in no respiratory distress PSYCH: Normally interactive.   Physical Exam   Laboratory and Imaging Data:  Results   Assessment and Plan:   No diagnosis found.  Assessment and Plan Assessment & Plan      Medication Management during today's office visit: No orders of the defined types were placed in this encounter.  There are no discontinued medications.  Orders placed today for conditions managed today: No orders of the defined types were placed in this encounter.   Disposition: No follow-ups on file.  [employer] speech-to-text software was used for transcription in this dictation.  Possible transcriptional errors can occur using Animal nutritionist.   Signed,  Jacques DASEN. Emberlie Gotcher, MD   Outpatient Encounter  Medications as of 04/17/2024  Medication Sig   alendronate  (FOSAMAX ) 70 MG tablet Take 1 tablet (70 mg total) by mouth every 7 (seven) days. Take with a full glass of water on an empty stomach.   augmented betamethasone  dipropionate (DIPROLENE -AF) 0.05 % cream As needed   Bisacodyl (LAXATIVE PO) Take 1 tablet by mouth daily.   diphenhydrAMINE (BENADRYL ALLERGY) 25 mg capsule Take 25 mg by mouth at bedtime.   No facility-administered encounter medications on file as of 04/17/2024.

## 2024-04-17 ENCOUNTER — Encounter: Payer: Self-pay | Admitting: Family Medicine

## 2024-04-17 ENCOUNTER — Ambulatory Visit (INDEPENDENT_AMBULATORY_CARE_PROVIDER_SITE_OTHER): Admitting: Family Medicine

## 2024-04-17 VITALS — BP 112/70 | HR 83 | Temp 98.9°F | Ht 64.0 in | Wt 138.4 lb

## 2024-04-17 DIAGNOSIS — M7541 Impingement syndrome of right shoulder: Secondary | ICD-10-CM

## 2024-04-17 DIAGNOSIS — M7542 Impingement syndrome of left shoulder: Secondary | ICD-10-CM | POA: Diagnosis not present

## 2024-04-17 DIAGNOSIS — M25611 Stiffness of right shoulder, not elsewhere classified: Secondary | ICD-10-CM

## 2024-04-17 DIAGNOSIS — M25511 Pain in right shoulder: Secondary | ICD-10-CM | POA: Diagnosis not present

## 2024-04-17 DIAGNOSIS — M25512 Pain in left shoulder: Secondary | ICD-10-CM | POA: Diagnosis not present

## 2024-04-17 DIAGNOSIS — G8929 Other chronic pain: Secondary | ICD-10-CM | POA: Diagnosis not present

## 2024-04-17 MED ORDER — PREDNISONE 20 MG PO TABS
ORAL_TABLET | ORAL | 0 refills | Status: DC
Start: 2024-04-17 — End: 2024-05-25

## 2024-05-20 ENCOUNTER — Ambulatory Visit: Payer: Self-pay

## 2024-05-20 ENCOUNTER — Ambulatory Visit (INDEPENDENT_AMBULATORY_CARE_PROVIDER_SITE_OTHER): Admitting: Family Medicine

## 2024-05-20 DIAGNOSIS — U071 COVID-19: Secondary | ICD-10-CM

## 2024-05-20 MED ORDER — BETAMETHASONE DIPROPIONATE AUG 0.05 % EX CREA: TOPICAL_CREAM | Freq: Every day | CUTANEOUS | 0 refills | Status: AC | PRN

## 2024-05-20 MED ORDER — NIRMATRELVIR/RITONAVIR (PAXLOVID)TABLET: 3.0000 | ORAL_TABLET | Freq: Two times a day (BID) | ORAL | 0 refills | Status: AC

## 2024-05-20 NOTE — Telephone Encounter (Signed)
 Looks like patient has been scheduled with Dr. Gasper today

## 2024-05-20 NOTE — Telephone Encounter (Signed)
 FYI Only or Action Required?: FYI only for provider.  Patient was last seen in primary care on 04/17/2024 by Watt Mirza, MD.  Called Nurse Triage reporting Sore Throat.  Symptoms began x 2 weeks.  Interventions attempted: Nothing.  Symptoms are: gradually worsening.  Triage Disposition: See Physician Within 24 Hours, Call PCP Within 24 Hours  Patient/caregiver understands and will follow disposition?: Yes   Copied from CRM #[id number]. Topic: Clinical - Red Word Triage >> May 20, 2024  8:12 AM Antwanette L wrote: Red Word that prompted transfer to Nurse Triage: Patient is  experiencing chills, itchy rash on her back, and coughing up a cloudy phlegm, eczema on both hands. Reason for Disposition  [1] Applying cream or ointment AND [2] causes severe itch, burning or pain  SEVERE throat pain (e.g., excruciating)    Started off with sore throat, drainage - now other cold s/d & rash  Answer Assessment - Initial Assessment Questions 1. ONSET: When did the throat start hurting? (Hours or days ago)      X week and worsening 2. SEVERITY: How bad is the sore throat? (Scale 1-10; mild, moderate or severe)     moderate 3. STREP EXPOSURE: Has there been any exposure to strep within the past week? If Yes, ask: What type of contact occurred?      na 4.  VIRAL SYMPTOMS: Are there any symptoms of a cold, such as a runny nose, cough, hoarse voice or red eyes?      Cough -some sputum cloudy/thick, aches,  5. FEVER: Do you have a fever? If Yes, ask: What is your temperature, how was it measured, and when did it start?     no 6. PUS ON THE TONSILS: Is there pus on the tonsils in the back of your throat?     no 7. OTHER SYMPTOMS: Do you have any other symptoms? (e.g., difficulty breathing, headache, rash)     Rash on lower back 8. PREGNANCY: Is there any chance you are pregnant? When was your last menstrual period?     na  Answer Assessment - Initial Assessment  Questions 1. APPEARANCE of RASH: What does the rash look like? (e.g., blisters, dry flaky skin, red spots, redness, sores)     Red, somewhat raised, crusty 2. LOCATION: Where is the rash located?      Lower back 3. NUMBER: How many spots are there?      na 4. SIZE: How big are the spots? (e.g., inches, cm; or compare to size of pinhead, tip of pen, eraser, pea)      unknown 5. ONSET: When did the rash start?      X 2 weeks 6. ITCHING: Does the rash itch? If Yes, ask: How bad is the itch?  (Scale 0-10; or none, mild, moderate, severe)     moderate 7. PAIN: Does the rash hurt? If Yes, ask: How bad is the pain?  (Scale 0-10; or none, mild, moderate, severe)     no 8. OTHER SYMPTOMS: Do you have any other symptoms? (e.g., fever)     Eczema,  9. PREGNANCY: Is there any chance you are pregnant? When was your last menstrual period?     na  Protocols used: Sore Throat-A-AH, Rash or Redness - Localized-A-AH

## 2024-05-20 NOTE — Progress Notes (Signed)
 Established patient visit   Patient: Caitlyn Martinez   DOB: 23-Sep-1950   73 y.o. Female  MRN: [id number] Visit Date: 05/20/2024  Today's healthcare provider: Nancyann Perry, MD   No chief complaint on file.  Subjective    Discussed the use of AI scribe software for clinical note transcription with the patient, who gave verbal consent to proceed.  History of Present Illness   Caitlyn Martinez is a 73 year old female who presents with symptoms of COVID-19.  She tested positive for COVID-19 this morning using an expired test. Symptoms began Thursday night with a scratchy throat, followed by coughing and congestion on Friday. By Saturday, she experienced chills but did not take her temperature and did not feel feverish. She describes her symptoms as 'achy' with cold-like symptoms, including a cough and congestion, primarily affecting her head. No difficulty breathing.  She has had COVID-19 a couple of times before and has received all the vaccines, though she has not had one this year. She is also due for her flu shot.  She mentions a rash on her back that has worsened since being in [place]. She has previously used betamethasone , which she finds effective for her eczema.  She has not taken Paxlovid  before, though her husband has.       Medications: Outpatient Medications Prior to Visit  Medication Sig   alendronate  (FOSAMAX ) 70 MG tablet Take 1 tablet (70 mg total) by mouth every 7 (seven) days. Take with a full glass of water on an empty stomach.   Bisacodyl (LAXATIVE PO) Take 1 tablet by mouth daily.   diphenhydrAMINE (BENADRYL ALLERGY) 25 mg capsule Take 25 mg by mouth at bedtime.   predniSONE  (DELTASONE ) 20 MG tablet 2 tabs po daily for 5 days, then 1 tab po daily for 5 days   [DISCONTINUED] augmented betamethasone  dipropionate (DIPROLENE -AF) 0.05 % cream As needed   No facility-administered medications prior to visit.   Review of Systems  Constitutional:  Negative for  appetite change, chills, fatigue and fever.  Respiratory:  Negative for chest tightness and shortness of breath.   Cardiovascular:  Negative for chest pain and palpitations.  Gastrointestinal:  Negative for abdominal pain, nausea and vomiting.  Neurological:  Negative for dizziness and weakness.      Objective    Physical Exam   General Appearance:    Well developed, well nourished female, alert, cooperative, in no acute distress  HENT:   neck without nodes, pharynx erythematous without exudate, and sinuses nontender  Eyes:    PERRL, conjunctiva/corneas clear, EOM's intact       Lungs:     Clear to auscultation bilaterally, respirations unlabored  Heart:    Normal heart rate. Normal rhythm. No murmurs, rubs, or gallops.    Neurologic:   Awake, alert, oriented x 3. No apparent focal neurological           defect.        Assessment & Plan        COVID-19 infection Positive COVID-19 test with mild symptoms. Paxlovid  considered optional but may reduce symptom duration if started promptly. - Prescribed Paxlovid , advised immediate start. - Advised over-the-counter decongestants and cold medications can be taken with Paxlovid . - Instructed to delay COVID booster for three to six months post-infection. - Advised flu shot is permissible.  Eczema of hands and back Eczema flare-up on hands and back, previously responsive to betamethasone  cream. Refill diprolen  Nancyann Perry, MD  [facility] [phone] (phone) [phone] (fax)  [facility]

## 2024-05-20 NOTE — Telephone Encounter (Signed)
 noted

## 2024-05-25 ENCOUNTER — Encounter: Payer: Self-pay | Admitting: Emergency Medicine

## 2024-05-25 ENCOUNTER — Ambulatory Visit
Admission: EM | Admit: 2024-05-25 | Discharge: 2024-05-25 | Disposition: A | Discharge status: Home / Self Care | Attending: Emergency Medicine | Admitting: Emergency Medicine

## 2024-05-25 DIAGNOSIS — R2242 Localized swelling, mass and lump, left lower limb: Secondary | ICD-10-CM

## 2024-05-25 DIAGNOSIS — W57XXXA Bitten or stung by nonvenomous insect and other nonvenomous arthropods, initial encounter: Secondary | ICD-10-CM | POA: Diagnosis not present

## 2024-05-25 DIAGNOSIS — S90569A Insect bite (nonvenomous), unspecified ankle, initial encounter: Secondary | ICD-10-CM | POA: Diagnosis not present

## 2024-05-25 MED ORDER — CEPHALEXIN 500 MG PO CAPS: 500.0000 mg | ORAL_CAPSULE | Freq: Two times a day (BID) | ORAL | 0 refills | Status: AC

## 2024-05-25 MED ORDER — PREDNISONE 10 MG (21) PO TBPK: ORAL_TABLET | Freq: Every day | ORAL | 0 refills | Status: DC

## 2024-05-25 NOTE — Discharge Instructions (Signed)
 You have been evaluated for the insect bite to your lower leg which appears to be a local reaction  Begin prednisone  every morning with food as directed to reduce inflammation and help with pain, avoid ibuprofen while taking you may use Tylenol if needed  Due to the weeping and blistering to the leg you will be placed on antibiotics to ensure that is not become infected  Take cephalexin  twice daily for 5 days  Clean over the blistered area with unscented soap and water at least once daily, easiest to do during normal hygiene  You may leave the area open to air or you can cover with a nonstick bandage  If the area begins to itch you may use topical Benadryl cream, calamine lotion, hydrocortisone or similar products  For pain you may use hydrocortisone or lidocaine cream as needed  Elevate the leg whenever sitting and lying to help reduce swelling  For any pain you may use oral Tylenol  If your symptoms continue to persist or worsen please follow-up for reevaluation

## 2024-05-25 NOTE — ED Provider Notes (Signed)
 [facility]    CSN: [id number] Arrival date & time: 05/25/24  1440      History   Chief Complaint Chief Complaint  Patient presents with   Insect Bite    HPI Caitlyn Martinez is a 73 y.o. female.   Patient presents for evaluation of swelling and tightness to the left lower extremity beginning 1 day ago after insect bite.  Endorses that she was mowing her lawn when she came in contact with a nest of some sort and flying insects came out of it but was only stung 1 time.  Associated pain which has improved since initial occurrence.  Has noticed clear drainage and weeping from the site.  Has attempted use of Benadryl, topical biafine and diprolene -af.   Past Medical History:  Diagnosis Date   Arthritis    Chicken pox    Osteoporosis     Patient Active Problem List   Diagnosis Date Noted   Bilateral shoulder pain 04/15/2024   Urinary frequency 08/03/2023   Cystitis with hematuria 08/03/2023   Overweight 09/08/2022   Decreased hearing 09/08/2022   Other constipation 08/03/2022   Establishing care with new doctor, encounter for 09/06/2021   Encounter for screening mammogram for malignant neoplasm of breast 09/06/2021   Elevated BP without diagnosis of hypertension 09/06/2021   Age-related osteoporosis without current pathological fracture 09/17/2018    Past Surgical History:  Procedure Laterality Date   RHINOPLASTY     TONSILLECTOMY AND ADENOIDECTOMY     TUBAL LIGATION  1987    OB History   No obstetric history on file.      Home Medications    Prior to Admission medications   Medication Sig Start Date End Date Taking? Authorizing Provider  cephALEXin  (KEFLEX ) 500 MG capsule Take 1 capsule (500 mg total) by mouth 2 (two) times daily for 5 days. 05/25/24 05/30/24 Yes Shereece Wellborn R, NP  predniSONE  (STERAPRED UNI-PAK 21 TAB) 10 MG (21) TBPK tablet Take by mouth daily. Take 6 tabs by mouth daily  for 1 days, then 5 tabs for 1 days, then 4 tabs for 1 days,  then 3 tabs for 1 days, 2 tabs for 1 days, then 1 tab by mouth daily for 1 days 05/25/24  Yes Gwendolyn Mclees R, NP  alendronate  (FOSAMAX ) 70 MG tablet Take 1 tablet (70 mg total) by mouth every 7 (seven) days. Take with a full glass of water on an empty stomach. 11/29/23   Wendee Lynwood HERO, NP  augmented betamethasone  dipropionate (DIPROLENE -AF) 0.05 % cream Apply topically daily as needed. 05/20/24   Gasper Nancyann BRAVO, MD  Bisacodyl (LAXATIVE PO) Take 1 tablet by mouth daily.    [provider]  diphenhydrAMINE (BENADRYL ALLERGY) 25 mg capsule Take 25 mg by mouth at bedtime.    [provider]  nirmatrelvir /ritonavir  (PAXLOVID ) 20 x 150 MG & 10 x 100MG  TABS Take 3 tablets by mouth 2 (two) times daily for 5 days. (Take nirmatrelvir  150 mg two tablets twice daily for 5 days and ritonavir  100 mg one tablet twice daily for 5 days) Patient GFR is 83 05/20/24 05/25/24  Fisher, Nancyann BRAVO, MD    Family History Family History  Problem Relation Age of Onset   Cancer Mother        Breast   Breast cancer Mother 66   Diabetes Father    Alcohol abuse Father    Cancer Father        Lung and Prostate  Social History Social History   Tobacco Use   Smoking status: Never   Smokeless tobacco: Never  Vaping Use   Vaping status: Never Used  Substance Use Topics   Alcohol use: Not Currently    Comment: occasional beer   Drug use: No     Allergies   Patient has no known allergies.   Review of Systems Review of Systems   Physical Exam Triage Vital Signs ED Triage Vitals  Encounter Vitals Group     BP 05/25/24 1454 134/81     Girls Systolic BP Percentile --      Girls Diastolic BP Percentile --      Boys Systolic BP Percentile --      Boys Diastolic BP Percentile --      Pulse Rate 05/25/24 1454 90     Resp 05/25/24 1454 18     Temp 05/25/24 1454 98 F (36.7 C)     Temp Source 05/25/24 1454 Tympanic     SpO2 05/25/24 1454 97 %     Weight --      Height --      Head  Circumference --      Peak Flow --      Pain Score 05/25/24 1450 2     Pain Loc --      Pain Education --      Exclude from Growth Chart --    No data found.  Updated Vital Signs BP 134/81 (BP Location: Left Arm)   Pulse 90   Temp 98 F (36.7 C) (Tympanic)   Resp 18   SpO2 97%   Visual Acuity Right Eye Distance:   Left Eye Distance:   Bilateral Distance:    Right Eye Near:   Left Eye Near:    Bilateral Near:     Physical Exam Constitutional:      Appearance: Normal appearance.  Eyes:     Extraocular Movements: Extraocular movements intact.  Pulmonary:     Effort: Pulmonary effort is normal.  Skin:    Comments: Less than 0.5 cm puncture present to the medial aspect of the left ankle with surrounding serous fluid blisters, generalized swelling to the left ankle and foot, 2+ pedal pulse, sensation intact  Neurological:     Mental Status: She is alert and oriented to person, place, and time. Mental status is at baseline.      UC Treatments / Results  Labs (all labs ordered are listed, but only abnormal results are displayed) Labs Reviewed - No data to display  EKG   Radiology No results found.  Procedures Procedures (including critical care time)  Medications Ordered in UC Medications - No data to display  Initial Impression / Assessment and Plan / UC Course  I have reviewed the triage vital signs and the nursing notes.  Pertinent labs & imaging results that were available during my care of the patient were reviewed by me and considered in my medical decision making (see chart for details).  Localized swelling of the left lower leg, insect bite of ankle with local reaction  presentation consistent with a localized reaction, prescribed prednisone  for management, blistering and weeping to the sting site, prophylactically placed on cephalexin , recommended daily cleansing with monitoring, recommended over-the-counter medications and nonpharmacological measures  for pain and itching, advised follow-up for symptoms that do not resolve Final Clinical Impressions(s) / UC Diagnoses   Final diagnoses:  Insect bite of ankle with local reaction, initial encounter  Localized swelling of left  lower leg     Discharge Instructions      You have been evaluated for the insect bite to your lower leg which appears to be a local reaction  Begin prednisone  every morning with food as directed to reduce inflammation and help with pain, avoid ibuprofen while taking you may use Tylenol if needed  Due to the weeping and blistering to the leg you will be placed on antibiotics to ensure that is not become infected  Take cephalexin  twice daily for 5 days  Clean over the blistered area with unscented soap and water at least once daily, easiest to do during normal hygiene  You may leave the area open to air or you can cover with a nonstick bandage  If the area begins to itch you may use topical Benadryl cream, calamine lotion, hydrocortisone or similar products  For pain you may use hydrocortisone or lidocaine cream as needed  Elevate the leg whenever sitting and lying to help reduce swelling  For any pain you may use oral Tylenol  If your symptoms continue to persist or worsen please follow-up for reevaluation   ED Prescriptions     Medication Sig Dispense Auth. Provider   predniSONE  (STERAPRED UNI-PAK 21 TAB) 10 MG (21) TBPK tablet Take by mouth daily. Take 6 tabs by mouth daily  for 1 days, then 5 tabs for 1 days, then 4 tabs for 1 days, then 3 tabs for 1 days, 2 tabs for 1 days, then 1 tab by mouth daily for 1 days 21 tablet Daryl Quiros R, NP   cephALEXin  (KEFLEX ) 500 MG capsule Take 1 capsule (500 mg total) by mouth 2 (two) times daily for 5 days. 10 capsule Arnie Maiolo R, NP      PDMP not reviewed this encounter.   Teresa Shelba SAUNDERS, NP 05/25/24 1529

## 2024-05-25 NOTE — ED Triage Notes (Signed)
 Patient reports that she got stung by an insect on yesterday on left foot. Patient now complains itching, pain, redness and swelling to site. Rate pain 2/10. Patient used Biafine cream with no improvement. Patient also used Diprolene -AF cream on site with no improvement.

## 2024-05-28 ENCOUNTER — Ambulatory Visit: Admitting: Nurse Practitioner

## 2024-06-13 DIAGNOSIS — Z23 Encounter for immunization: Secondary | ICD-10-CM | POA: Diagnosis not present

## 2024-07-04 DIAGNOSIS — D2261 Melanocytic nevi of right upper limb, including shoulder: Secondary | ICD-10-CM | POA: Diagnosis not present

## 2024-07-04 DIAGNOSIS — D225 Melanocytic nevi of trunk: Secondary | ICD-10-CM | POA: Diagnosis not present

## 2024-07-04 DIAGNOSIS — D2262 Melanocytic nevi of left upper limb, including shoulder: Secondary | ICD-10-CM | POA: Diagnosis not present

## 2024-07-04 DIAGNOSIS — D2271 Melanocytic nevi of right lower limb, including hip: Secondary | ICD-10-CM | POA: Diagnosis not present

## 2024-07-04 DIAGNOSIS — D2272 Melanocytic nevi of left lower limb, including hip: Secondary | ICD-10-CM | POA: Diagnosis not present

## 2024-07-04 DIAGNOSIS — L570 Actinic keratosis: Secondary | ICD-10-CM | POA: Diagnosis not present

## 2024-07-04 DIAGNOSIS — L821 Other seborrheic keratosis: Secondary | ICD-10-CM | POA: Diagnosis not present

## 2024-09-12 ENCOUNTER — Ambulatory Visit: Payer: Medicare Other | Admitting: Nurse Practitioner

## 2024-09-12 ENCOUNTER — Encounter: Payer: Self-pay | Admitting: Nurse Practitioner

## 2024-09-12 VITALS — BP 132/70 | HR 85 | Temp 98.4°F | Ht 64.0 in | Wt 142.0 lb

## 2024-09-12 DIAGNOSIS — K5909 Other constipation: Secondary | ICD-10-CM | POA: Diagnosis not present

## 2024-09-12 DIAGNOSIS — Z13220 Encounter for screening for lipoid disorders: Secondary | ICD-10-CM | POA: Diagnosis not present

## 2024-09-12 DIAGNOSIS — M810 Age-related osteoporosis without current pathological fracture: Secondary | ICD-10-CM

## 2024-09-12 LAB — COMPREHENSIVE METABOLIC PANEL WITH GFR
ALT: 14 U/L (ref 3–35)
AST: 16 U/L (ref 5–37)
Albumin: 4 g/dL (ref 3.5–5.2)
Alkaline Phosphatase: 54 U/L (ref 39–117)
BUN: 13 mg/dL (ref 6–23)
CO2: 33 meq/L — ABNORMAL HIGH (ref 19–32)
Calcium: 9.1 mg/dL (ref 8.4–10.5)
Chloride: 102 meq/L (ref 96–112)
Creatinine, Ser: 0.68 mg/dL (ref 0.40–1.20)
GFR: 86.94 mL/min
Glucose, Bld: 89 mg/dL (ref 70–99)
Potassium: 4.1 meq/L (ref 3.5–5.1)
Sodium: 138 meq/L (ref 135–145)
Total Bilirubin: 0.4 mg/dL (ref 0.2–1.2)
Total Protein: 6.3 g/dL (ref 6.0–8.3)

## 2024-09-12 LAB — CBC
HCT: 37 % (ref 36.0–46.0)
Hemoglobin: 12.5 g/dL (ref 12.0–15.0)
MCHC: 33.8 g/dL (ref 30.0–36.0)
MCV: 93.7 fl (ref 78.0–100.0)
Platelets: 209 K/uL (ref 150.0–400.0)
RBC: 3.95 Mil/uL (ref 3.87–5.11)
RDW: 12.6 % (ref 11.5–15.5)
WBC: 4.5 K/uL (ref 4.0–10.5)

## 2024-09-12 LAB — LIPID PANEL
Cholesterol: 156 mg/dL (ref 28–200)
HDL: 59.1 mg/dL
LDL Cholesterol: 80 mg/dL (ref 10–99)
NonHDL: 97.16
Total CHOL/HDL Ratio: 3
Triglycerides: 85 mg/dL (ref 10.0–149.0)
VLDL: 17 mg/dL (ref 0.0–40.0)

## 2024-09-12 LAB — TSH: TSH: 2.07 u[IU]/mL (ref 0.35–5.50)

## 2024-09-12 NOTE — Patient Instructions (Addendum)
 Nice to see you today I will be in touch with the labs once I have them  Follow up with me in 1 year, sooner If you need me   Call and scheduled your mammogram at  [facility] [address] ( on hospital grounds) [place], [place]  [phone]   Your last one was done on 11/23/2023

## 2024-09-12 NOTE — Assessment & Plan Note (Signed)
 History of same most recent DEXA scan reviewed T-score of -2.8 patient on alendronate  70 mg weekly.  Patient is been off therapy since 2023 will need to take a holiday in 2028.

## 2024-09-12 NOTE — Progress Notes (Signed)
 "  Established Patient Office Visit  Subjective   Patient ID: Caitlyn Martinez, female    DOB: [birth year]  Age: 74 y.o. MRN: [id number]  Chief Complaint  Patient presents with   Annual Exam    HPI  Osteoporosis: Patient currently maintained on alendronate  70 mg weekly.  Patient has been on medication since 2023  for complete physical and follow up of chronic conditions.  Immunizations: -Tetanus: Completed in 2016 -Influenza: up to date -Shingles: Completed Shingrix series -Pneumonia: Completed 2022 -covid : up to date   Diet: Fair diet. She is eating 3 meals a day and snacks.she is drinking coffee and water.  Exercise: She is doing her treadmill. She will do 2 miles a day. She will do some body weight exercises and stretches  Eye exam: yearly and does reading glasses Dental exam: Completes semi-annually    Colonoscopy: Completed in 08/02/2023, repeat 5 years.  Patient due 2029 Lung Cancer Screening: NA  Pap smear: Aged out  DEXA: 11/23/2023 That showed a T-score of -2.8.  Of the right femur  Mammogram: 11/23/2023, repeat 1 year  Advance directive: 12/27/2022 living will  Sleep: going to bed around 10-11 and will get up around 630-7. Feels rested most of the time and does not snore       Review of Systems  Constitutional:  Negative for chills and fever.  Respiratory:  Negative for shortness of breath.   Cardiovascular:  Negative for chest pain and leg swelling.  Gastrointestinal:  Negative for abdominal pain, blood in stool, constipation, diarrhea, nausea and vomiting.       BM daily.  She does use a laxative   Genitourinary:  Negative for dysuria and hematuria.  Neurological:  Negative for dizziness, tingling and headaches.  Psychiatric/Behavioral:  Negative for hallucinations and suicidal ideas.       Objective:     BP 132/70 (BP Location: Left Arm, Patient Position: Sitting, Cuff Size: Normal)   Pulse 85   Temp 98.4 F (36.9 C) (Temporal)   Ht 5' 4 (1.626  m)   Wt 142 lb (64.4 kg)   SpO2 96%   BMI 24.37 kg/m  BP Readings from Last 3 Encounters:  09/12/24 132/70  05/25/24 134/81  04/17/24 112/70   Wt Readings from Last 3 Encounters:  09/12/24 142 lb (64.4 kg)  04/17/24 138 lb 6 oz (62.8 kg)  04/15/24 139 lb (63 kg)   SpO2 Readings from Last 3 Encounters:  09/12/24 96%  05/25/24 97%  04/17/24 96%      Physical Exam Vitals and nursing note reviewed.  Constitutional:      Appearance: Normal appearance.  HENT:     Right Ear: Tympanic membrane, ear canal and external ear normal.     Left Ear: Tympanic membrane, ear canal and external ear normal.     Mouth/Throat:     Mouth: Mucous membranes are moist.     Pharynx: Oropharynx is clear.  Eyes:     Extraocular Movements: Extraocular movements intact.     Pupils: Pupils are equal, round, and reactive to light.  Cardiovascular:     Rate and Rhythm: Normal rate and regular rhythm.     Pulses: Normal pulses.     Heart sounds: Normal heart sounds.  Pulmonary:     Effort: Pulmonary effort is normal.     Breath sounds: Normal breath sounds.  Abdominal:     General: Bowel sounds are normal. There is no distension.     Palpations: There is  no mass.     Tenderness: There is no abdominal tenderness.     Hernia: No hernia is present.  Musculoskeletal:     Right lower leg: No edema.     Left lower leg: No edema.  Lymphadenopathy:     Cervical: No cervical adenopathy.  Skin:    General: Skin is warm.  Neurological:     General: No focal deficit present.     Mental Status: She is alert.     Deep Tendon Reflexes:     Reflex Scores:      Bicep reflexes are 2+ on the right side and 2+ on the left side.      Patellar reflexes are 2+ on the right side and 2+ on the left side.    Comments: Bilateral upper and lower extremity strength 5/5  Psychiatric:        Mood and Affect: Mood normal.        Behavior: Behavior normal.        Thought Content: Thought content normal.         Judgment: Judgment normal.      No results found for any visits on 09/12/24.    The 10-year ASCVD risk score (Arnett DK, et al., 2019) is: 11.7%    Assessment & Plan:   Problem List Items Addressed This Visit       Musculoskeletal and Integument   Age-related osteoporosis without current pathological fracture - Primary   History of same most recent DEXA scan reviewed T-score of -2.8 patient on alendronate  70 mg weekly.  Patient is been off therapy since 2023 will need to take a holiday in 2028.      Relevant Orders   CBC   Comprehensive metabolic panel with GFR   TSH     Other   Other constipation   History of the same patient will take laxative daily we did discuss dependency on stimulant laxatives.  Encourage patient to drink adequate fluid and switch over to MiraLAX.  She can do 1-3 doses a day in the beginning to have bowel movements and then backed off to 1 dose daily thereafter.  Did encourage patient to have glass of MiraLAX in the mirror that with a glass of water each dose      Relevant Orders   CBC   Comprehensive metabolic panel with GFR   TSH   Other Visit Diagnoses       Screening for lipid disorders       Relevant Orders   Lipid panel       No follow-ups on file.    Adina Crandall, NP  "

## 2024-09-12 NOTE — Assessment & Plan Note (Signed)
 History of the same patient will take laxative daily we did discuss dependency on stimulant laxatives.  Encourage patient to drink adequate fluid and switch over to MiraLAX.  She can do 1-3 doses a day in the beginning to have bowel movements and then backed off to 1 dose daily thereafter.  Did encourage patient to have glass of MiraLAX in the mirror that with a glass of water each dose

## 2024-09-15 ENCOUNTER — Ambulatory Visit: Payer: Self-pay | Admitting: Nurse Practitioner

## 2024-09-19 ENCOUNTER — Encounter: Payer: Self-pay | Admitting: Nurse Practitioner

## 2024-09-19 ENCOUNTER — Telehealth: Payer: Self-pay

## 2024-09-19 DIAGNOSIS — Z1231 Encounter for screening mammogram for malignant neoplasm of breast: Secondary | ICD-10-CM

## 2024-09-19 NOTE — Telephone Encounter (Signed)
 Called and spoke with patient.  Relayed information which patient verbalized understanding and did not have any questions or concerns.

## 2024-09-19 NOTE — Telephone Encounter (Signed)
 Order has been placed.

## 2024-09-19 NOTE — Addendum Note (Signed)
 Addended by: WENDEE LYNWOOD HERO on: 09/19/2024 01:54 PM   Modules accepted: Orders

## 2024-09-19 NOTE — Telephone Encounter (Signed)
 Copied from CRM #[id number]. Topic: Clinical - Request for Lab/Test Order >> Sep 19, 2024 10:52 AM Robinson DEL wrote: Reason for CRM: Patient states when she was in for her physical with [facility] he was supposed to send in order for patient to get her mammogram to [facility]. Patient states she's not due until March but wants to get it schedule and office doesn't have referral. No orders or referrals in system  [facility] [phone]

## 2024-11-25 ENCOUNTER — Encounter

## 2025-01-09 ENCOUNTER — Ambulatory Visit

## 2025-01-17 ENCOUNTER — Ambulatory Visit

## 2025-09-15 ENCOUNTER — Encounter: Admitting: Nurse Practitioner
